# Patient Record
Sex: Male | Born: 1955 | Race: Black or African American | Hispanic: No | Marital: Single | Smoking: Current some day smoker
Health system: Southern US, Community
[De-identification: ages and names within clinical notes are randomized; demographics above are authoritative.]

## PROBLEM LIST (undated history)

## (undated) DIAGNOSIS — M1990 Unspecified osteoarthritis, unspecified site: Secondary | ICD-10-CM

## (undated) DIAGNOSIS — B1920 Unspecified viral hepatitis C without hepatic coma: Secondary | ICD-10-CM

## (undated) DIAGNOSIS — J45909 Unspecified asthma, uncomplicated: Secondary | ICD-10-CM

## (undated) DIAGNOSIS — I10 Essential (primary) hypertension: Secondary | ICD-10-CM

## (undated) DIAGNOSIS — M549 Dorsalgia, unspecified: Secondary | ICD-10-CM

## (undated) HISTORY — PX: HEMORRHOID SURGERY: SHX153

---

## 2014-08-07 ENCOUNTER — Emergency Department
Admission: EM | Admit: 2014-08-07 | Discharge: 2014-08-07 | Disposition: A | Discharge status: Home / Self Care | Payer: Self-pay | Attending: Emergency Medicine | Admitting: Emergency Medicine

## 2014-08-07 ENCOUNTER — Encounter: Payer: Self-pay | Admitting: Emergency Medicine

## 2014-08-07 DIAGNOSIS — Z720 Tobacco use: Secondary | ICD-10-CM | POA: Insufficient documentation

## 2014-08-07 DIAGNOSIS — B86 Scabies: Secondary | ICD-10-CM | POA: Insufficient documentation

## 2014-08-07 MED ORDER — DIPHENHYDRAMINE HCL 50 MG PO CAPS
50.0000 mg | ORAL_CAPSULE | Freq: Once | ORAL | Status: AC
  Administered 2014-08-07: 50 mg via ORAL
  Filled 2014-08-07: qty 1

## 2014-08-07 MED ORDER — PERMETHRIN 5 % EX CREA
TOPICAL_CREAM | Freq: Once | CUTANEOUS | Status: AC
  Administered 2014-08-07: 1 via TOPICAL
  Filled 2014-08-07: qty 60

## 2014-08-07 NOTE — ED Provider Notes (Signed)
[facility] Emergency Department Provider Note  ____________________________________________  Time seen: 5:45 AM  I have reviewed the triage vital signs and the nursing notes.   HISTORY  Chief Complaint Rash     HPI Connor Marquez is a 59 y.o. male presents with generalized pruritic rash 2 days on trunk and bilateral upper extremities. Patient denies any fever. Patient admits to having pets at home. Patient describes her axes "itching and burning all over"     Past medical history None  Past surgical history None No current outpatient prescriptions on file.  Allergies Review of patient's allergies indicates no known allergies.  No family history on file.  Social History History  Substance Use Topics  . Smoking status: Current Every Day Smoker -- 1.00 packs/day    Types: Cigarettes  . Smokeless tobacco: Not on file  . Alcohol Use: Yes    Review of Systems  Constitutional: Negative for fever. Eyes: Negative for visual changes. ENT: Negative for sore throat. Cardiovascular: Negative for chest pain. Respiratory: Negative for shortness of breath. Gastrointestinal: Negative for abdominal pain, vomiting and diarrhea. Genitourinary: Negative for dysuria. Musculoskeletal: Negative for back pain. Skin: Negative for rash. Neurological: Negative for headaches, focal weakness or numbness.   10-point ROS otherwise negative.  ____________________________________________   PHYSICAL EXAM:  VITAL SIGNS: ED Triage Vitals  Enc Vitals Group     BP 08/07/14 0531 133/83 mmHg     Pulse Rate 08/07/14 0531 60     Resp 08/07/14 0531 18     Temp 08/07/14 0531 97.8 F (36.6 C)     Temp Source 08/07/14 0531 Oral     SpO2 08/07/14 0531 98 %     Weight 08/07/14 0531 190 lb (86.183 kg)     Height 08/07/14 0531 6' (1.829 m)     Head Cir --      Peak Flow --      Pain Score 08/07/14 0532 10     Pain Loc --      Pain Edu? --      Excl. in GC? --       Constitutional: Alert and oriented. Well appearing and in no distress. Eyes: Conjunctivae are normal. PERRL. Normal extraocular movements. ENT   Head: Normocephalic and atraumatic.   Nose: No congestion/rhinnorhea.   Mouth/Throat: Mucous membranes are moist.   Neck: No stridor. Cardiovascular: Normal rate, regular rhythm. Normal and symmetric distal pulses are present in all extremities. No murmurs, rubs, or gallops. Respiratory: Normal respiratory effort without tachypnea nor retractions. Breath sounds are clear and equal bilaterally. No wheezes/rales/rhonchi. Gastrointestinal: Soft and nontender. No distention. There is no CVA tenderness. Genitourinary: deferred Musculoskeletal: Nontender with normal range of motion in all extremities. No joint effusions.  No lower extremity tenderness nor edema. Neurologic:  Normal speech and language. No gross focal neurologic deficits are appreciated. Speech is normal.  Skin:  Generalized rash consistent with scabies areas of creeping eruption. Psychiatric: Mood and affect are normal. Speech and behavior are normal. Patient exhibits appropriate insight and judgment.       INITIAL IMPRESSION / ASSESSMENT AND PLAN / ED COURSE  Pertinent labs & imaging results that were available during my care of the patient were reviewed by me and considered in my medical decision making (see chart for details).  Patient given permethrin and Benadryl  ____________________________________________   FINAL CLINICAL IMPRESSION(S) / ED DIAGNOSES  Final diagnoses:  Scabies      Darci Current, MD 08/07/14 [phone]

## 2014-08-07 NOTE — ED Notes (Signed)

## 2014-08-07 NOTE — ED Notes (Signed)
Patient present to ED with complaint of generalized body pain and rash on stomach and bilateral arms beginning 2 days ago. Patient believes rash is from "working on trees." Rash noted to abdomen, chest, and bilateral arms. Patient complaining of "itching and burning all over" and cramping in hands 15 min PTAPatient denies chest pain, shortness of breath, abdominal pain, urinary problems. Patient alert and oriented x 4, respirations even and unlabored.

## 2014-08-07 NOTE — Discharge Instructions (Signed)

## 2014-08-29 ENCOUNTER — Encounter: Payer: Self-pay | Admitting: Emergency Medicine

## 2014-08-29 ENCOUNTER — Emergency Department
Admission: EM | Admit: 2014-08-29 | Discharge: 2014-08-29 | Disposition: A | Discharge status: Home / Self Care | Payer: Self-pay | Attending: Emergency Medicine | Admitting: Emergency Medicine

## 2014-08-29 DIAGNOSIS — Z720 Tobacco use: Secondary | ICD-10-CM | POA: Insufficient documentation

## 2014-08-29 DIAGNOSIS — L255 Unspecified contact dermatitis due to plants, except food: Secondary | ICD-10-CM | POA: Insufficient documentation

## 2014-08-29 MED ORDER — HYDROXYZINE PAMOATE 25 MG PO CAPS: 25.0000 mg | ORAL_CAPSULE | Freq: Three times a day (TID) | ORAL | Status: DC | PRN

## 2014-08-29 MED ORDER — DEXAMETHASONE SODIUM PHOSPHATE 10 MG/ML IJ SOLN
10.0000 mg | Freq: Once | INTRAMUSCULAR | Status: AC
  Administered 2014-08-29: 10 mg via INTRAMUSCULAR
  Filled 2014-08-29: qty 1

## 2014-08-29 MED ORDER — PREDNISONE 10 MG PO TABS: 60.0000 mg | ORAL_TABLET | Freq: Every day | ORAL | Status: DC

## 2014-08-29 NOTE — Discharge Instructions (Signed)

## 2014-08-29 NOTE — ED Provider Notes (Signed)
[facility] Emergency Department Provider Note ____________________________________________  Time seen: Approximately 9:50 AM  I have reviewed the triage vital signs and the nursing notes.   HISTORY  Chief Complaint Rash   HPI Connor Marquez is a 59 y.o. male presents to the emergency department for evaluation of rash.He states he has been working outside clearing brush and has developed a rash to his hands and legs that is now spreading to his entire body including his face. He states that the rash is causing significant itching. He denies feeling shortness of breath or feeling that his throat is swollen inside.   History reviewed. No pertinent past medical history.  There are no active problems to display for this patient.   History reviewed. No pertinent past surgical history.  Current Outpatient Rx  Name  Route  Sig  Dispense  Refill  . hydrOXYzine (VISTARIL) 25 MG capsule   Oral   Take 1 capsule (25 mg total) by mouth 3 (three) times daily as needed.   30 capsule   0   . predniSONE (DELTASONE) 10 MG tablet   Oral   Take 6 tablets (60 mg total) by mouth daily. Take 6 tablets x 3 days Take 5 tablets x 3 days Take 4 tablets x 3 days Take 3 tablets x 3 days Take 2 tablets x 3 days Take 1 tablet x 3 days   63 tablet   0     Allergies Review of patient's allergies indicates no known allergies.  No family history on file.  Social History Social History  Substance Use Topics  . Smoking status: Current Every Day Smoker -- 1.00 packs/day    Types: Cigarettes  . Smokeless tobacco: Never Used  . Alcohol Use: Yes    Review of Systems   Constitutional: No fever/chills Eyes: No visual changes. ENT: No difficulty swallowing. No rash/blisters in ears, nose, or throat Cardiovascular: Denies chest pain. Respiratory: Denies shortness of breath. Gastrointestinal: No abdominal pain.  No nausea, no vomiting.  No diarrhea.  No  constipation. Genitourinary: Negative for dysuria. Musculoskeletal: Negative for back pain. Skin: Rash all over Neurological: Negative for headaches, focal weakness or numbness.  10-point ROS otherwise negative.  ____________________________________________   PHYSICAL EXAM:  VITAL SIGNS: ED Triage Vitals  Enc Vitals Group     BP 08/29/14 0918 127/77 mmHg     Pulse Rate 08/29/14 0918 80     Resp 08/29/14 0918 18     Temp 08/29/14 0918 97.8 F (36.6 C)     Temp Source 08/29/14 0918 Oral     SpO2 08/29/14 0918 97 %     Weight 08/29/14 0918 195 lb (88.451 kg)     Height 08/29/14 0918 6' (1.829 m)     Head Cir --      Peak Flow --      Pain Score 08/29/14 0920 8     Pain Loc --      Pain Edu? --      Excl. in GC? --     Constitutional: Alert and oriented. Well appearing and in no acute distress. Eyes: Conjunctivae are normal. PERRL. EOMI. Head: Atraumatic. Nose: No congestion/rhinnorhea. Mouth/Throat: Mucous membranes are moist.  Oropharynx non-erythematous. No oral lesions. Neck: No stridor. Cardiovascular: Normal rate, regular rhythm.  Good peripheral circulation. Respiratory: Normal respiratory effort.  No retractions. Lungs CTAB. Gastrointestinal: Soft and nontender. No distention. No abdominal bruits.  Musculoskeletal: No lower extremity tenderness nor edema.  No joint effusions. Neurologic:  Normal speech and language. No gross focal neurologic deficits are appreciated. Speech is normal. No gait instability. Skin: Vesicular rash covering forearms, legs, and abdomen. Scattered vesicles present on eyelids, which are mildly edematous. Psychiatric: Mood and affect are normal. Speech and behavior are normal.  ____________________________________________   LABS (all labs ordered are listed, but only abnormal results are displayed)  Labs Reviewed - No data to  display ____________________________________________  EKG   ____________________________________________  RADIOLOGY   ____________________________________________   PROCEDURES  Procedure(s) performed:  ____________________________________________   INITIAL IMPRESSION / ASSESSMENT AND PLAN / ED COURSE  Pertinent labs & imaging results that were available during my care of the patient were reviewed by me and considered in my medical decision making (see chart for details).  IM Decadron given. Rx for 14 day taper given as well. Patient instructed to return to the ER for symptoms that are not improving over the next 24-48 hours. ____________________________________________   FINAL CLINICAL IMPRESSION(S) / ED DIAGNOSES  Final diagnoses:  Contact dermatitis due to plant       Chinita Pester, FNP 08/29/14 1424  Jene Every, MD 08/29/14 [phone]

## 2014-08-29 NOTE — ED Notes (Signed)
Pt reports rash that started yesterday noted all over body. Pt also reports itching and blistering of rash. Unknown fever reports cold chills

## 2014-08-29 NOTE — ED Notes (Signed)
Pt has red rash noted on arms and face. Pt states that it is all over body.

## 2014-11-12 ENCOUNTER — Emergency Department
Admission: EM | Admit: 2014-11-12 | Discharge: 2014-11-12 | Disposition: A | Discharge status: Home / Self Care | Payer: Self-pay | Attending: Emergency Medicine | Admitting: Emergency Medicine

## 2014-11-12 DIAGNOSIS — J069 Acute upper respiratory infection, unspecified: Secondary | ICD-10-CM | POA: Insufficient documentation

## 2014-11-12 DIAGNOSIS — Z720 Tobacco use: Secondary | ICD-10-CM | POA: Insufficient documentation

## 2014-11-12 MED ORDER — IBUPROFEN 800 MG PO TABS
800.0000 mg | ORAL_TABLET | Freq: Once | ORAL | Status: AC
  Administered 2014-11-12: 800 mg via ORAL
  Filled 2014-11-12: qty 1

## 2014-11-12 MED ORDER — DEXAMETHASONE 6 MG PO TABS
6.0000 mg | ORAL_TABLET | Freq: Once | ORAL | Status: AC
  Administered 2014-11-12: 6 mg via ORAL
  Filled 2014-11-12: qty 1

## 2014-11-12 MED ORDER — IBUPROFEN 200 MG PO TABS: 600.0000 mg | ORAL_TABLET | Freq: Four times a day (QID) | ORAL | Status: DC | PRN

## 2014-11-12 MED ORDER — GUAIFENESIN 100 MG/5ML PO SOLN: 5.0000 mL | ORAL | Status: DC | PRN

## 2014-11-12 MED ORDER — ACETAMINOPHEN 500 MG PO TABS
1000.0000 mg | ORAL_TABLET | Freq: Once | ORAL | Status: AC
  Administered 2014-11-12: 1000 mg via ORAL
  Filled 2014-11-12: qty 2

## 2014-11-12 MED ORDER — GI COCKTAIL ~~LOC~~
30.0000 mL | ORAL | Status: AC
  Administered 2014-11-12: 30 mL via ORAL
  Filled 2014-11-12: qty 30

## 2014-11-12 NOTE — ED Notes (Signed)
Pt dc home ambulatory pain unchanged instructed on follow up plan and med use PT NAD AT DC 

## 2014-11-12 NOTE — ED Provider Notes (Signed)
[facility] Emergency Department Provider Note  ____________________________________________  Time seen: 6:10 AM  I have reviewed the triage vital signs and the nursing notes.   HISTORY  Chief Complaint Migraine and Recurrent Sinusitis    HPI Connor Marquez is a 59 y.o. male who complains of sinus pressure in the left side of the face for the past 2 days. He is also had a sore throat and nonproductive cough. No fevers or chills. He also has rhinorrhea. No body aches. No trauma. No chest pain shortness of breath dizziness or syncope.     History reviewed. No pertinent past medical history.   There are no active problems to display for this patient.    History reviewed. No pertinent past surgical history.   Current Outpatient Rx  Name  Route  Sig  Dispense  Refill  . guaiFENesin (ROBITUSSIN) 100 MG/5ML SOLN   Oral   Take 5 mLs (100 mg total) by mouth every 4 (four) hours as needed for cough or to loosen phlegm.   120 mL   0   . hydrOXYzine (VISTARIL) 25 MG capsule   Oral   Take 1 capsule (25 mg total) by mouth 3 (three) times daily as needed.   30 capsule   0   . ibuprofen (MOTRIN IB) 200 MG tablet   Oral   Take 3 tablets (600 mg total) by mouth every 6 (six) hours as needed.   60 tablet   0   . predniSONE (DELTASONE) 10 MG tablet   Oral   Take 6 tablets (60 mg total) by mouth daily. Take 6 tablets x 3 days Take 5 tablets x 3 days Take 4 tablets x 3 days Take 3 tablets x 3 days Take 2 tablets x 3 days Take 1 tablet x 3 days   63 tablet   0      Allergies Review of patient's allergies indicates no known allergies.   History reviewed. No pertinent family history.  Social History Social History  Substance Use Topics  . Smoking status: Current Every Day Smoker -- 1.00 packs/day    Types: Cigarettes  . Smokeless tobacco: Never Used  . Alcohol Use: Yes    Review of Systems  Constitutional:   No fever or chills. No  weight changes Eyes:   No blurry vision or double vision.  ENT:   Positive sore throat. Positive sinus pressure worse on the left Cardiovascular:   No chest pain. Respiratory:   No dyspnea or cough. Gastrointestinal:   Negative for abdominal pain, vomiting and diarrhea.  No BRBPR or melena. Genitourinary:   Negative for dysuria, urinary retention, bloody urine, or difficulty urinating. Musculoskeletal:   Negative for back pain. No joint swelling or pain. Skin:   Negative for rash. Neurological:   Negative for headaches, focal weakness or numbness. Psychiatric:  No anxiety or depression.   Endocrine:  No hot/cold intolerance, changes in energy, or sleep difficulty.  10-point ROS otherwise negative.  ____________________________________________   PHYSICAL EXAM:  VITAL SIGNS: ED Triage Vitals  Enc Vitals Group     BP 11/12/14 0543 136/82 mmHg     Pulse Rate 11/12/14 0543 84     Resp 11/12/14 0543 18     Temp 11/12/14 0543 99 F (37.2 C)     Temp Source 11/12/14 0543 Oral     SpO2 11/12/14 0543 97 %     Weight 11/12/14 0539 190 lb (86.183 kg)     Height 11/12/14 0539   (1.854 m)     Head Cir --      Peak Flow --      Pain Score 11/12/14 0540 9     Pain Loc --      Pain Edu? --      Excl. in GC? --      Constitutional:   Alert and oriented. Well appearing and in no distress. Eyes:   No scleral icterus. No conjunctival pallor. PERRL. EOMI ENT   Head:   Normocephalic and atraumatic. TMs normal except for mild injection of the left TM. External canals normal. No pain on percussion of sinuses   Nose:   No congestion/rhinnorhea. No septal hematoma. Slightly boggy turbinates with some increased secretions   Mouth/Throat:   MMM, significant pharyngeal erythema. No peritonsillar mass. No uvula shift.   Neck:   No stridor. No SubQ emphysema. No meningismus. Full range of motion Hematological/Lymphatic/Immunilogical:   No cervical lymphadenopathy. Cardiovascular:    RRR. Normal and symmetric distal pulses are present in all extremities. No murmurs, rubs, or gallops. Respiratory:   Normal respiratory effort without tachypnea nor retractions. Breath sounds are clear and equal bilaterally. No wheezes/rales/rhonchi. Gastrointestinal:   Soft and nontender. No distention. There is no CVA tenderness.  No rebound, rigidity, or guarding. Genitourinary:   deferred Musculoskeletal:   Nontender with normal range of motion in all extremities. No joint effusions.  No lower extremity tenderness.  No edema. Neurologic:   Normal speech and language.  CN 2-10 normal. Motor grossly intact. No pronator drift.  Normal gait. No gross focal neurologic deficits are appreciated.  Skin:    Skin is warm, dry and intact. No rash noted.  No petechiae, purpura, or bullae. Psychiatric:   Mood and affect are normal. Speech and behavior are normal. Patient exhibits appropriate insight and judgment.  ____________________________________________    LABS (pertinent positives/negatives) (all labs ordered are listed, but only abnormal results are displayed) Labs Reviewed - No data to display ____________________________________________   EKG    ____________________________________________    RADIOLOGY    ____________________________________________   PROCEDURES   ____________________________________________   INITIAL IMPRESSION / ASSESSMENT AND PLAN / ED COURSE  Pertinent labs & imaging results that were available during my care of the patient were reviewed by me and considered in my medical decision making (see chart for details).  Patient presents with sore throat and nonproductive cough and sinus pressure. This is 2 days worth and he has unremarkable vital signs. Low suspicion for a malignant infection or bacterial sinusitis. At present it appears he has an upper respiratory infection. We'll give him some medications for symptom relief have him follow-up with  primary care in about a week. Added willfully requests pain medication for his sinus pain and so I did counsel him that this is not something that warrants opioid pain medications and anti-inflammatory pain medicine should be sufficient and appropriate for this indication. Low suspicion for intracranial hemorrhage or stroke. No evidence of glaucoma and temporal arteritis pseudotumor or trauma.     ____________________________________________   FINAL CLINICAL IMPRESSION(S) / ED DIAGNOSES  Final diagnoses:  Upper respiratory infection      Sharman Cheek, MD 11/12/14 [phone]

## 2014-11-12 NOTE — Discharge Instructions (Signed)
Upper Respiratory Infection, Adult Most upper respiratory infections (URIs) are a viral infection of the air passages leading to the lungs. A URI affects the nose, throat, and upper air passages. The most common type of URI is nasopharyngitis and is typically referred to as "the common cold." URIs run their course and usually go away on their own. Most of the time, a URI does not require medical attention, but sometimes a bacterial infection in the upper airways can follow a viral infection. This is called a secondary infection. Sinus and middle ear infections are common types of secondary upper respiratory infections. Bacterial pneumonia can also complicate a URI. A URI can worsen asthma and chronic obstructive pulmonary disease (COPD). Sometimes, these complications can require emergency medical care and may be life threatening.  CAUSES Almost all URIs are caused by viruses. A virus is a type of germ and can spread from one person to another.  RISKS FACTORS You may be at risk for a URI if:   You smoke.   You have chronic heart or lung disease.  You have a weakened defense (immune) system.   You are very young or very old.   You have nasal allergies or asthma.  You work in crowded or poorly ventilated areas.  You work in health care facilities or schools. SIGNS AND SYMPTOMS  Symptoms typically develop 2-3 days after you come in contact with a cold virus. Most viral URIs last 7-10 days. However, viral URIs from the influenza virus (flu virus) can last 14-18 days and are typically more severe. Symptoms may include:   Runny or stuffy (congested) nose.   Sneezing.   Cough.   Sore throat.   Headache.   Fatigue.   Fever.   Loss of appetite.   Pain in your forehead, behind your eyes, and over your cheekbones (sinus pain).  Muscle aches.  DIAGNOSIS  Your health care provider may diagnose a URI by:  Physical exam.  Tests to check that your symptoms are not due to  another condition such as:  Strep throat.  Sinusitis.  Pneumonia.  Asthma. TREATMENT  A URI goes away on its own with time. It cannot be cured with medicines, but medicines may be prescribed or recommended to relieve symptoms. Medicines may help:  Reduce your fever.  Reduce your cough.  Relieve nasal congestion. HOME CARE INSTRUCTIONS   Take medicines only as directed by your health care provider.   Gargle warm saltwater or take cough drops to comfort your throat as directed by your health care provider.  Use a warm mist humidifier or inhale steam from a shower to increase air moisture. This may make it easier to breathe.  Drink enough fluid to keep your urine clear or pale yellow.   Eat soups and other clear broths and maintain good nutrition.   Rest as needed.   Return to work when your temperature has returned to normal or as your health care provider advises. You may need to stay home longer to avoid infecting others. You can also use a face mask and careful hand washing to prevent spread of the virus.  Increase the usage of your inhaler if you have asthma.   Do not use any tobacco products, including cigarettes, chewing tobacco, or electronic cigarettes. If you need help quitting, ask your health care provider. PREVENTION  The best way to protect yourself from getting a cold is to practice good hygiene.   Avoid oral or hand contact with people with cold   symptoms.   Wash your hands often if contact occurs.  There is no clear evidence that vitamin C, vitamin E, echinacea, or exercise reduces the chance of developing a cold. However, it is always recommended to get plenty of rest, exercise, and practice good nutrition.  SEEK MEDICAL CARE IF:   You are getting worse rather than better.   Your symptoms are not controlled by medicine.   You have chills.  You have worsening shortness of breath.  You have brown or red mucus.  You have yellow or brown nasal  discharge.  You have pain in your face, especially when you bend forward.  You have a fever.  You have swollen neck glands.  You have pain while swallowing.  You have white areas in the back of your throat. SEEK IMMEDIATE MEDICAL CARE IF:   You have severe or persistent:  Headache.  Ear pain.  Sinus pain.  Chest pain.  You have chronic lung disease and any of the following:  Wheezing.  Prolonged cough.  Coughing up blood.  A change in your usual mucus.  You have a stiff neck.  You have changes in your:  Vision.  Hearing.  Thinking.  Mood. MAKE SURE YOU:   Understand these instructions.  Will watch your condition.  Will get help right away if you are not doing well or get worse.   This information is not intended to replace advice given to you by your health care provider. Make sure you discuss any questions you have with your health care provider.   Document Released: 06/28/2000 Document Revised: 05/19/2014 Document Reviewed: 04/09/2013 Elsevier Interactive Patient Education 2016 [employer]  

## 2014-11-12 NOTE — ED Notes (Signed)
Patient to ED with complaints of a migraine that he states he feels like is coming from a sinus infection "because I've got all this sinus congestion." Symptoms started two days ago.

## 2014-12-26 ENCOUNTER — Emergency Department
Admission: EM | Admit: 2014-12-26 | Discharge: 2014-12-26 | Disposition: A | Discharge status: Home / Self Care | Payer: Self-pay | Attending: Student | Admitting: Student

## 2014-12-26 ENCOUNTER — Encounter: Payer: Self-pay | Admitting: *Deleted

## 2014-12-26 ENCOUNTER — Emergency Department: Payer: Self-pay

## 2014-12-26 DIAGNOSIS — Y998 Other external cause status: Secondary | ICD-10-CM | POA: Insufficient documentation

## 2014-12-26 DIAGNOSIS — W1839XA Other fall on same level, initial encounter: Secondary | ICD-10-CM | POA: Insufficient documentation

## 2014-12-26 DIAGNOSIS — I10 Essential (primary) hypertension: Secondary | ICD-10-CM | POA: Insufficient documentation

## 2014-12-26 DIAGNOSIS — Z7952 Long term (current) use of systemic steroids: Secondary | ICD-10-CM | POA: Insufficient documentation

## 2014-12-26 DIAGNOSIS — R55 Syncope and collapse: Secondary | ICD-10-CM | POA: Insufficient documentation

## 2014-12-26 DIAGNOSIS — S52021A Displaced fracture of olecranon process without intraarticular extension of right ulna, initial encounter for closed fracture: Secondary | ICD-10-CM | POA: Insufficient documentation

## 2014-12-26 DIAGNOSIS — Y9301 Activity, walking, marching and hiking: Secondary | ICD-10-CM | POA: Insufficient documentation

## 2014-12-26 DIAGNOSIS — F17210 Nicotine dependence, cigarettes, uncomplicated: Secondary | ICD-10-CM | POA: Insufficient documentation

## 2014-12-26 DIAGNOSIS — Y9289 Other specified places as the place of occurrence of the external cause: Secondary | ICD-10-CM | POA: Insufficient documentation

## 2014-12-26 HISTORY — DX: Unspecified viral hepatitis C without hepatic coma: B19.20

## 2014-12-26 HISTORY — DX: Essential (primary) hypertension: I10

## 2014-12-26 HISTORY — DX: Unspecified osteoarthritis, unspecified site: M19.90

## 2014-12-26 LAB — BASIC METABOLIC PANEL
Anion gap: 6 (ref 5–15)
BUN: 16 mg/dL (ref 6–20)
CO2: 29 mmol/L (ref 22–32)
CREATININE: 1.2 mg/dL (ref 0.61–1.24)
Calcium: 9.2 mg/dL (ref 8.9–10.3)
Chloride: 104 mmol/L (ref 101–111)
GFR calc Af Amer: >60 mL/min (ref >60)
GFR calc non Af Amer: >60 mL/min (ref >60)
Glucose, Bld: 103 mg/dL — ABNORMAL HIGH (ref 65–99)
POTASSIUM: 4.2 mmol/L (ref 3.5–5.1)
SODIUM: 139 mmol/L (ref 135–145)

## 2014-12-26 LAB — CBC
HCT: 44.7 % (ref 40.0–52.0)
Hemoglobin: 15.3 g/dL (ref 13.0–18.0)
MCH: 32.3 pg (ref 26.0–34.0)
MCHC: 34.2 g/dL (ref 32.0–36.0)
MCV: 94.4 fL (ref 80.0–100.0)
Platelets: 156 K/uL (ref 150–440)
RBC: 4.74 MIL/uL (ref 4.40–5.90)
RDW: 13.5 % (ref 11.5–14.5)
WBC: 6.5 K/uL (ref 3.8–10.6)

## 2014-12-26 LAB — TROPONIN I: Troponin I: <0.03 ng/mL

## 2014-12-26 MED ORDER — MORPHINE SULFATE (PF) 4 MG/ML IV SOLN
4.0000 mg | Freq: Once | INTRAVENOUS | Status: AC
  Administered 2014-12-26: 4 mg via INTRAVENOUS
  Filled 2014-12-26: qty 1

## 2014-12-26 MED ORDER — OXYCODONE HCL 5 MG PO TABS: 5.0000 mg | ORAL_TABLET | Freq: Four times a day (QID) | ORAL | Status: DC | PRN

## 2014-12-26 MED ORDER — ONDANSETRON HCL 4 MG/2ML IJ SOLN
4.0000 mg | Freq: Once | INTRAMUSCULAR | Status: AC
  Administered 2014-12-26: 4 mg via INTRAVENOUS
  Filled 2014-12-26: qty 2

## 2014-12-26 NOTE — ED Notes (Signed)
Pt. returned from XR. 

## 2014-12-26 NOTE — ED Notes (Signed)
Pt splinted with 5" cotton material, 5" splinting material, and Large splint to right arm.  OK'd by Dr. Inocencio HomesGayle

## 2014-12-26 NOTE — ED Notes (Signed)
Pt reports syncopal episode when walking to the bathroom. When he woke, he was laying on the floor. Pt states he remained light-headed after regaining consciousness and sustained injury to R elbow at this time and presents to the ED w/ swelling and pain to  R elbow. Pt states he continues to be slightly dizzy at this time. Grips and push pulls on legs and arms equal and strong. Pt has no facial droop or tongue deviation. Pt's primary complaint is R elbow pain.

## 2014-12-26 NOTE — ED Provider Notes (Addendum)
[facility] Emergency Department Provider Note  ____________________________________________  Time seen: Approximately 3:44 AM  I have reviewed the triage vital signs and the nursing notes.   HISTORY  Chief Complaint Loss of Consciousness and Elbow Injury    HPI Connor Marquez is a 59 y.o. male with history of hypertension, arthritis, hepatitis C who presents for evaluation of syncopal episode with fall and right elbow pain which began suddenly just PTA,  Pain has been constant since onset, is worse with movement, currently severe. Patient reports that he stood up to go to the bathroom. He began walking to the bathroom and started feeling lightheaded and fainted, falling onto the right elbow. He denies hitting his head. He reports that when he woke up he still continuing to feel lightheaded. No chest pain or difficulty breathing, no vomiting, diarrhea, fevers or chills. No palpitations. He denies any headache or neck pain. He has had syncopal episodes similar to this in the past.   Past Medical History  Diagnosis Date  . Hypertension   . Arthritis   . Hepatitis C     There are no active problems to display for this patient.   Past Surgical History  Procedure Laterality Date  . Hemorrhoid surgery      Current Outpatient Rx  Name  Route  Sig  Dispense  Refill  . guaiFENesin (ROBITUSSIN) 100 MG/5ML SOLN   Oral   Take 5 mLs (100 mg total) by mouth every 4 (four) hours as needed for cough or to loosen phlegm.   120 mL   0   . hydrOXYzine (VISTARIL) 25 MG capsule   Oral   Take 1 capsule (25 mg total) by mouth 3 (three) times daily as needed.   30 capsule   0   . ibuprofen (MOTRIN IB) 200 MG tablet   Oral   Take 3 tablets (600 mg total) by mouth every 6 (six) hours as needed.   60 tablet   0   . oxyCODONE (ROXICODONE) 5 MG immediate release tablet   Oral   Take 1 tablet (5 mg total) by mouth every 6 (six) hours as needed for moderate pain. Do  not drive while taking this medication.   15 tablet   0   . predniSONE (DELTASONE) 10 MG tablet   Oral   Take 6 tablets (60 mg total) by mouth daily. Take 6 tablets x 3 days Take 5 tablets x 3 days Take 4 tablets x 3 days Take 3 tablets x 3 days Take 2 tablets x 3 days Take 1 tablet x 3 days   63 tablet   0     Allergies Review of patient's allergies indicates no known allergies.  History reviewed. No pertinent family history.  Social History Social History  Substance Use Topics  . Smoking status: Current Every Day Smoker -- 1.00 packs/day    Types: Cigarettes  . Smokeless tobacco: Never Used  . Alcohol Use: Yes     Comment: occasionally    Review of Systems Constitutional: No fever/chills Eyes: No visual changes. ENT: No sore throat. Cardiovascular: Denies chest pain. Respiratory: Denies shortness of breath. Gastrointestinal: No abdominal pain.  No nausea, no vomiting.  No diarrhea.  No constipation. Genitourinary: Negative for dysuria. Musculoskeletal: Negative for back pain. Skin: Negative for rash. Neurological: Negative for headaches, focal weakness or numbness.  10-point ROS otherwise negative.  ____________________________________________   PHYSICAL EXAM:  VITAL SIGNS: ED Triage Vitals  Enc Vitals Group  BP 12/26/14 0233 140/77 mmHg     Pulse Rate 12/26/14 0233 73     Resp 12/26/14 0233 18     Temp 12/26/14 0233 98.2 F (36.8 C)     Temp Source 12/26/14 0233 Oral     SpO2 12/26/14 0233 98 %     Weight 12/26/14 0233 190 lb (86.183 kg)     Height 12/26/14 0233 6' (1.829 m)     Head Cir --      Peak Flow --      Pain Score 12/26/14 0243 10     Pain Loc --      Pain Edu? --      Excl. in GC? --     Constitutional: Alert and oriented. Well appearing and generally in no acute distress though He grimaces in pain with movement of the right elbow. Eyes: Conjunctivae are normal. PERRL. EOMI. Head: Atraumatic. Nose: No  congestion/rhinnorhea. Mouth/Throat: Mucous membranes are moist.  Oropharynx non-erythematous. Neck: No stridor.  No midline C-spine tenderness to palpation. Cardiovascular: Normal rate, regular rhythm. Grossly normal heart sounds.  Good peripheral circulation. Respiratory: Normal respiratory effort.  No retractions. Lungs CTAB. Gastrointestinal: Soft and nontender. No distention.  No CVA tenderness. Genitourinary: deferred Musculoskeletal: Tenderness throughout the right elbow however the patient has full though painful flexion and extension at the elbow. The right radial, median and ulnar nerve are intact. 2+ right radial pulse. Neurologic:  Normal speech and language. No gross focal neurologic deficits are appreciated. No gait instability. 5 out of 5 strength in bilateral upper and lower extremity, sensation intact to light touch throughout. Skin:  Skin is warm, dry and intact. No rash noted. Psychiatric: Mood and affect are normal. Speech and behavior are normal.  ____________________________________________   LABS (all labs ordered are listed, but only abnormal results are displayed)  Labs Reviewed  BASIC METABOLIC PANEL - Abnormal; Notable for the following:    Glucose, Bld 103 (*)    All other components within normal limits  CBC  TROPONIN I  URINALYSIS COMPLETEWITH MICROSCOPIC ([facility] ONLY)  CBG MONITORING, ED   ____________________________________________  EKG  ED ECG REPORT I, Gayla DossGayle, Burnett Lieber A, the attending physician, personally viewed and interpreted this ECG.   Date: 12/26/2014  EKG Time: 02:23  Rate: 73  Rhythm: normal EKG, normal sinus rhythm  Axis: normal  Intervals:none  ST&T Change: No acute ST elevation. No STEMI, no Brugada, normal QTC.  ____________________________________________  RADIOLOGY  CXR IMPRESSION: No acute pulmonary process.  Right elbow xray FINDINGS: There is a prominent olecranon spur, linear lucency through the spur may reflect  fragmentation versus spur fracture. There is associated soft tissue prominence in the region of the spur. Otherwise no acute fracture. Alignment is maintained. Mild degenerative change at the proximal radial ulnar joint. No joint effusion.  IMPRESSION: Olecranon spur with adjacent soft tissue prominence. Linear lucency through the spur may reflect fragmentation versus a spur fracture.  There is otherwise no acute fracture of the elbow. ____________________________________________   PROCEDURES  Procedure(s) performed:   SPLINT APPLICATION Date/Time: 6:02 AM Authorized by: Toney RakesGayle, Ole Lafon A Consent: Verbal consent obtained. Consent given by: patient Splint applied by: nurse Location details: right arm Splint type: long arm splint with posterior slab Supplies used: orthoglas Post-procedure: The splinted body part was neurovascularly unchanged following the procedure. Patient tolerance: Patient tolerated the procedure well with no immediate complications.    Critical Care performed: No  ____________________________________________   INITIAL IMPRESSION / ASSESSMENT AND PLAN / ED  COURSE  Pertinent labs & imaging results that were available during my care of the patient were reviewed by me and considered in my medical decision making (see chart for details).   Connor Marquez is a 59 y.o. male with history of hypertension, arthritis, hepatitis C who presents for evaluation of syncopal episode with fall and right elbow pain which began suddenly just PTA,  Pain has been constant since onset, is worse with movement, currently severe. On exam, he is generally well-appearing and in no acute distress he does have pain and tenderness at the right elbow. He is neurovascularly intact in the right arm. His exam is otherwise atraumatic and has no other pain complaints. Suspect vasovagal syncope with possible component of orthostasis given described prodrome, sudden onset after standing. No  chest pain or difficulty breathing, EKG normal sinus rhythm, doubt Cardiogenic syncope. Troponin negative. Intact neurological examination and I doubt purely neurogenic cause of syncope. Head is atraumatic. No history of any CNS disorder and I do not think he requires CT head at this time. CBC and BMP are unremarkable. Chest x-ray clear. Plain films of the right elbow show a linear lucency through a prominent olecranon spur which could represent fragmentation versus a spur fracture. I discussed this with Dr. Hyacinth Meeker, on-call for orthopedic surgery, he has reviewed films, reports equivocal fracture but as the patient is neurovascular intact with intact tricep function, he recommends splinting, pain control and discharge. I discussed this with the patient as well as return precautions and he is comfortable with the discharge plan. DC home. ____________________________________________   FINAL CLINICAL IMPRESSION(S) / ED DIAGNOSES  Final diagnoses:  Vasovagal syncope  Olecranon fracture, right, closed, initial encounter      Gayla Doss, MD 12/26/14 1610  Gayla Doss, MD 12/26/14 9604  Gayla Doss, MD 12/26/14 [phone]

## 2014-12-26 NOTE — ED Notes (Signed)
Pt to XR

## 2015-07-07 ENCOUNTER — Emergency Department: Payer: Self-pay

## 2015-07-07 ENCOUNTER — Emergency Department
Admission: EM | Admit: 2015-07-07 | Discharge: 2015-07-07 | Disposition: A | Discharge status: Home / Self Care | Payer: Self-pay | Attending: Emergency Medicine | Admitting: Emergency Medicine

## 2015-07-07 DIAGNOSIS — I10 Essential (primary) hypertension: Secondary | ICD-10-CM | POA: Insufficient documentation

## 2015-07-07 DIAGNOSIS — M1990 Unspecified osteoarthritis, unspecified site: Secondary | ICD-10-CM | POA: Insufficient documentation

## 2015-07-07 DIAGNOSIS — M7022 Olecranon bursitis, left elbow: Secondary | ICD-10-CM | POA: Insufficient documentation

## 2015-07-07 DIAGNOSIS — F17210 Nicotine dependence, cigarettes, uncomplicated: Secondary | ICD-10-CM | POA: Insufficient documentation

## 2015-07-07 DIAGNOSIS — Y939 Activity, unspecified: Secondary | ICD-10-CM | POA: Insufficient documentation

## 2015-07-07 DIAGNOSIS — M779 Enthesopathy, unspecified: Secondary | ICD-10-CM

## 2015-07-07 DIAGNOSIS — F1290 Cannabis use, unspecified, uncomplicated: Secondary | ICD-10-CM | POA: Insufficient documentation

## 2015-07-07 DIAGNOSIS — M25722 Osteophyte, left elbow: Secondary | ICD-10-CM | POA: Insufficient documentation

## 2015-07-07 DIAGNOSIS — Z7952 Long term (current) use of systemic steroids: Secondary | ICD-10-CM | POA: Insufficient documentation

## 2015-07-07 MED ORDER — MELOXICAM 15 MG PO TABS: 15.0000 mg | ORAL_TABLET | Freq: Every day | ORAL | Status: DC

## 2015-07-07 MED ORDER — TRAMADOL HCL 50 MG PO TABS: 50.0000 mg | ORAL_TABLET | Freq: Four times a day (QID) | ORAL | Status: DC | PRN

## 2015-07-07 MED ORDER — TRAMADOL HCL 50 MG PO TABS
50.0000 mg | ORAL_TABLET | Freq: Once | ORAL | Status: AC
  Administered 2015-07-07: 50 mg via ORAL
  Filled 2015-07-07: qty 1

## 2015-07-07 MED ORDER — SULFAMETHOXAZOLE-TRIMETHOPRIM 800-160 MG PO TABS: 1.0000 | ORAL_TABLET | Freq: Two times a day (BID) | ORAL | Status: DC

## 2015-07-07 NOTE — ED Notes (Signed)
Pt to ED via POV c/o left elbow pain. Per pt elbow pain is sharp and came on all of sudden a couple of days ago and has gotten progressively worse. Pt denies injury. Strong radial pulse, cap refill < 3 sec; pt alert and oriented in no acute distress at this time.

## 2015-07-07 NOTE — Discharge Instructions (Signed)
Elbow Bursitis  Elbow bursitis is inflammation of the fluid-filled sac (bursa) between the tip of your elbow bone (olecranon) and your skin. Elbow bursitis may also be called olecranon bursitis.  Normally, the olecranon bursa has only a small amount of fluid in it to cushion and protect your elbow bone. Elbow bursitis causes fluid to build up inside the bursa. Over time, this swelling and inflammation can cause pain when you bend or lean on your elbow.   CAUSES  Elbow bursitis may be caused by:    Elbow injury (acute trauma).   Leaning on hard surfaces for long periods of time.   Infection from an injury that breaks the skin near your elbow.   A bone growth (spur) that forms at the tip of your elbow.   A medical condition that causes inflammation in your body, such as gout or rheumatoid arthritis.   The cause may also be unknown.   SIGNS AND SYMPTOMS   The first sign of elbow bursitis is usually swelling over the tip of your elbow. This can grow to be the size of a golf ball. This may start suddenly or develop gradually. You may also have:   Pain when bending or leaning on your elbow.   Restricted movement of your elbow.   If your bursitis is caused by an infection, symptoms may also include:   Redness, warmth, and tenderness of the elbow.   Drainage of pus from the swollen area over your elbow, if the skin breaks open.  DIAGNOSIS   Your health care provider may be able to diagnose elbow bursitis based on your signs and symptoms, especially if you have recently been injured. Your health care provider will also do a physical exam. This may include:   X-rays to look for a bone spur or a bone fracture.   Draining fluid from the bursa to test it for infection.   Blood tests to rule out gout or rheumatoid arthritis.  TREATMENT   Treatment for elbow bursitis depends on the cause. Treatment may include:   Medicines. These may include:    Over-the-counter medicines to relieve pain and inflammation.     Antibiotic medicines to fight infection.    Injections of anti-inflammatory medicines (steroids).   Wrapping your elbow with a bandage.   Draining fluid from the bursa.   Wearing elbow pads.   If your bursitis does not get better with treatment, surgery may be needed to remove the bursa.   HOME CARE INSTRUCTIONS    Take medicines only as directed by your health care provider.   If you were prescribed an antibiotic medicine, finish all of it even if you start to feel better.   If your bursitis is caused by an injury, rest your elbow and wear your bandage as directed by your health care provider. You may alsoapply ice to the injured area as directed by your health care provider:   Put ice in a plastic bag.   Place a towel between your skin and the bag.   Leave the ice on for 20 minutes, 2-3 times per day.   Avoid any activities that cause elbow pain.   Use elbow pads or elbow wraps to cushion your elbow.  SEEK MEDICAL CARE IF:   You have a fever.    Your symptoms do not get better with treatment.   Your pain or swelling gets worse.   Your elbow pain or swelling goes away and then returns.   You have   drainage of pus from the swollen area over your elbow.     This information is not intended to replace advice given to you by your health care provider. Make sure you discuss any questions you have with your health care provider.     Document Released: 02/01/2006 Document Revised: 01/23/2014 Document Reviewed: 09/10/2013  Elsevier Interactive Patient Education 2016 [employer]

## 2015-07-07 NOTE — ED Provider Notes (Signed)
[facility] Emergency Department Provider Note  ____________________________________________  Time seen: Approximately 1:57 PM  I have reviewed the triage vital signs and the nursing notes.   HISTORY  Chief Complaint Elbow Pain   HPI Connor Marquez is a 60 y.o. male who arrives to the emergency department with complaint of left elbow pain since Saturday. Pain has been worsening over the last several days, is described as a constant throbbing, irritating pain and rated at 9/10. States "It feels like there is something in it or something trying to come out of it, like the bone." He is unable to recall any trauma or insect bite to the elbow. He has not attempted any pharmacologic or non-pharmacologic management and states that he has found no alleviating factors. Pain is exacerbated by movement and applied pressure. Denies recent repetitive motion, fever, chills, headache, nausea and vomiting.   Past Medical History  Diagnosis Date  . Hypertension   . Arthritis   . Hepatitis C     There are no active problems to display for this patient.   Past Surgical History  Procedure Laterality Date  . Hemorrhoid surgery      Current Outpatient Rx  Name  Route  Sig  Dispense  Refill  . guaiFENesin (ROBITUSSIN) 100 MG/5ML SOLN   Oral   Take 5 mLs (100 mg total) by mouth every 4 (four) hours as needed for cough or to loosen phlegm.   120 mL   0   . meloxicam (MOBIC) 15 MG tablet   Oral   Take 1 tablet (15 mg total) by mouth daily.   30 tablet   0   . predniSONE (DELTASONE) 10 MG tablet   Oral   Take 6 tablets (60 mg total) by mouth daily. Take 6 tablets x 3 days Take 5 tablets x 3 days Take 4 tablets x 3 days Take 3 tablets x 3 days Take 2 tablets x 3 days Take 1 tablet x 3 days   63 tablet   0   . sulfamethoxazole-trimethoprim (BACTRIM DS,SEPTRA DS) 800-160 MG tablet   Oral   Take 1 tablet by mouth 2 (two) times daily.   20 tablet   0   .  traMADol (ULTRAM) 50 MG tablet   Oral   Take 1 tablet (50 mg total) by mouth every 6 (six) hours as needed.   12 tablet   0     Allergies Review of patient's allergies indicates no known allergies.  History reviewed. No pertinent family history.  Social History Social History  Substance Use Topics  . Smoking status: Current Every Day Smoker -- 1.00 packs/day    Types: Cigarettes  . Smokeless tobacco: Never Used  . Alcohol Use: Yes     Comment: occasionally    Review of Systems  Constitutional: Negative for fever/chills Respiratory: Negative for shortness of breath. Musculoskeletal: Positive for pain. Skin: Positive for lesion. Neurological: Negative for headaches, focal weakness or numbness. ____________________________________________   PHYSICAL EXAM:  VITAL SIGNS: ED Triage Vitals  Enc Vitals Group     BP 07/07/15 1334 145/93 mmHg     Pulse Rate 07/07/15 1334 74     Resp 07/07/15 1334 18     Temp 07/07/15 1334 98 F (36.7 C)     Temp Source 07/07/15 1334 Oral     SpO2 07/07/15 1334 98 %     Weight 07/07/15 1334 180 lb (81.647 kg)     Height 07/07/15 1334 6' (1.829  m)     Head Cir --      Peak Flow --      Pain Score 07/07/15 1335 9     Pain Loc --      Pain Edu? --      Excl. in GC? --      Constitutional: Alert and oriented. Well appearing and in no acute distress. Eyes: Conjunctivae are normal. EOMI. Nose: No congestion/rhinnorhea. Mouth/Throat: Mucous membranes are moist.   Neck: No stridor. Cardiovascular: Good peripheral circulation. Respiratory: Normal respiratory effort.  No retractions. Musculoskeletal: FROM throughout. Equal strength bilateral upper extremities, specifically triceps with 5+ strength bilaterally. Neurologic:  Normal speech and language. No gross focal neurologic deficits are appreciated. Skin:  2cm fluctuant area over the olecranon on the ulnar side that is draining clear/purulent substance. Mild surrounding erythema without  induration. FROM of left shoulder, wrist, hand, and fingers  ____________________________________________   LABS (all labs ordered are listed, but only abnormal results are displayed)  Labs Reviewed - No data to display ____________________________________________  EKG   ____________________________________________  RADIOLOGY  1. Soft tissue swelling overlying the olecranon, possible olecranon bursitis. 2. Fragmented spur of the olecranon - the fragmentation of this per could be acute given the appearance. Triceps injury not excluded, correlate with triceps strength. ____________________________________________   PROCEDURES  Procedure(s) performed: None ____________________________________________   INITIAL IMPRESSION / ASSESSMENT AND PLAN / ED COURSE  Pertinent labs & imaging results that were available during my care of the patient were reviewed by me and considered in my medical decision making (see chart for details).  He will be advised to take Bactrim as prescribed and until finished. He is to follow up with orthopedics and is to call today to schedule an appointment.    He was also advised to return to the emergency department for symptoms that change or worsen if unable to schedule an appointment.  ____________________________________________   FINAL CLINICAL IMPRESSION(S) / ED DIAGNOSES  Final diagnoses:  Olecranon bursitis of left elbow  Bone spur of other site    Discharge Medication List as of 07/07/2015  2:47 PM    START taking these medications   Details  meloxicam (MOBIC) 15 MG tablet Take 1 tablet (15 mg total) by mouth daily., Starting 07/07/2015, Until Discontinued, Print    sulfamethoxazole-trimethoprim (BACTRIM DS,SEPTRA DS) 800-160 MG tablet Take 1 tablet by mouth 2 (two) times daily., Starting 07/07/2015, Until Discontinued, Print    traMADol (ULTRAM) 50 MG tablet Take 1 tablet (50 mg total) by mouth every 6 (six) hours as needed., Starting  07/07/2015, Until Discontinued, Print        Chinita PesterCari B Bohdan Macho, FNP 07/07/15 1757  Minna AntisKevin Paduchowski, MD 07/08/15 2007

## 2016-03-11 ENCOUNTER — Emergency Department: Payer: Self-pay

## 2016-03-11 ENCOUNTER — Encounter: Payer: Self-pay | Admitting: Emergency Medicine

## 2016-03-11 ENCOUNTER — Emergency Department
Admission: EM | Admit: 2016-03-11 | Discharge: 2016-03-11 | Disposition: A | Discharge status: Home / Self Care | Payer: Self-pay | Attending: Emergency Medicine | Admitting: Emergency Medicine

## 2016-03-11 DIAGNOSIS — J45909 Unspecified asthma, uncomplicated: Secondary | ICD-10-CM | POA: Insufficient documentation

## 2016-03-11 DIAGNOSIS — I10 Essential (primary) hypertension: Secondary | ICD-10-CM | POA: Insufficient documentation

## 2016-03-11 DIAGNOSIS — F17210 Nicotine dependence, cigarettes, uncomplicated: Secondary | ICD-10-CM | POA: Insufficient documentation

## 2016-03-11 DIAGNOSIS — Z79899 Other long term (current) drug therapy: Secondary | ICD-10-CM | POA: Insufficient documentation

## 2016-03-11 DIAGNOSIS — R55 Syncope and collapse: Secondary | ICD-10-CM | POA: Insufficient documentation

## 2016-03-11 HISTORY — DX: Dorsalgia, unspecified: M54.9

## 2016-03-11 HISTORY — DX: Unspecified asthma, uncomplicated: J45.909

## 2016-03-11 LAB — LIPASE, BLOOD: Lipase: 28 U/L (ref 11–51)

## 2016-03-11 LAB — COMPREHENSIVE METABOLIC PANEL
ALBUMIN: 3.6 g/dL (ref 3.5–5.0)
ALT: 63 U/L (ref 17–63)
AST: 73 U/L — ABNORMAL HIGH (ref 15–41)
Alkaline Phosphatase: 98 U/L (ref 38–126)
Anion gap: 4 — ABNORMAL LOW (ref 5–15)
BUN: 12 mg/dL (ref 6–20)
CHLORIDE: 107 mmol/L (ref 101–111)
CO2: 28 mmol/L (ref 22–32)
CREATININE: 0.96 mg/dL (ref 0.61–1.24)
Calcium: 8.6 mg/dL — ABNORMAL LOW (ref 8.9–10.3)
GFR calc Af Amer: >60 mL/min (ref >60)
GLUCOSE: 108 mg/dL — AB (ref 65–99)
POTASSIUM: 3.8 mmol/L (ref 3.5–5.1)
Sodium: 139 mmol/L (ref 135–145)
Total Bilirubin: 0.4 mg/dL (ref 0.3–1.2)
Total Protein: 7.2 g/dL (ref 6.5–8.1)

## 2016-03-11 LAB — TROPONIN I: Troponin I: <0.03 ng/mL (ref <0.03)

## 2016-03-11 LAB — GLUCOSE, CAPILLARY
GLUCOSE-CAPILLARY: 102 mg/dL — AB (ref 65–99)
GLUCOSE-CAPILLARY: 112 mg/dL — AB (ref 65–99)

## 2016-03-11 LAB — URINALYSIS, ROUTINE W REFLEX MICROSCOPIC
BACTERIA UA: NONE SEEN
Bilirubin Urine: NEGATIVE
GLUCOSE, UA: NEGATIVE mg/dL
KETONES UR: NEGATIVE mg/dL
LEUKOCYTES UA: NEGATIVE
NITRITE: NEGATIVE
PH: 5 (ref 5.0–8.0)
Protein, ur: NEGATIVE mg/dL
Specific Gravity, Urine: 1.013 (ref 1.005–1.030)
Squamous Epithelial / LPF: NONE SEEN

## 2016-03-11 LAB — CBC
HCT: 41 % (ref 40.0–52.0)
Hemoglobin: 14.3 g/dL (ref 13.0–18.0)
MCH: 32.4 pg (ref 26.0–34.0)
MCHC: 34.9 g/dL (ref 32.0–36.0)
MCV: 92.8 fL (ref 80.0–100.0)
PLATELETS: 130 10*3/uL — AB (ref 150–440)
RBC: 4.42 MIL/uL (ref 4.40–5.90)
RDW: 13.9 % (ref 11.5–14.5)
WBC: 5 10*3/uL (ref 3.8–10.6)

## 2016-03-11 LAB — ETHANOL: Alcohol, Ethyl (B): 5 mg/dL — ABNORMAL HIGH (ref <5)

## 2016-03-11 MED ORDER — HYDROCODONE-ACETAMINOPHEN 5-325 MG PO TABS: 1.0000 | ORAL_TABLET | ORAL | 0 refills | Status: DC | PRN

## 2016-03-11 MED ORDER — HYDROCODONE-ACETAMINOPHEN 5-325 MG PO TABS
ORAL_TABLET | ORAL | Status: AC
  Administered 2016-03-11: 1 via ORAL
  Filled 2016-03-11: qty 1

## 2016-03-11 MED ORDER — SODIUM CHLORIDE 0.9 % IV BOLUS (SEPSIS)
1000.0000 mL | Freq: Once | INTRAVENOUS | Status: AC
  Administered 2016-03-11: 1000 mL via INTRAVENOUS

## 2016-03-11 MED ORDER — HYDROCODONE-ACETAMINOPHEN 5-325 MG PO TABS
1.0000 | ORAL_TABLET | Freq: Once | ORAL | Status: AC
  Administered 2016-03-11: 1 via ORAL

## 2016-03-11 NOTE — ED Notes (Signed)
Pt. States family reported seizure/syncope episode.  Pt. Reports he has had syncope episodes in the past.  Pt. Denies LOC.  Pt. Reports no injuries from fall.  Pt. Reports chronic back pain and rt. Rib pain.

## 2016-03-11 NOTE — ED Triage Notes (Signed)
Pt. Family reports seizure/syncope like episode.

## 2016-03-11 NOTE — ED Notes (Signed)
Pt. Going home with family. 

## 2016-03-11 NOTE — ED Notes (Signed)
Pt. CBG 112 and repeat check of 102.

## 2016-03-11 NOTE — ED Provider Notes (Signed)
[facility] Emergency Department Provider Note  Time seen: 1:10 AM  I have reviewed the triage vital signs and the nursing notes.   HISTORY  Chief Complaint Loss of Consciousness (Pt. here via EMS for seizures/syncope like episode.)    HPI Connor Marquez is a 61 y.o. male with a past medical history of asthma, arthritis, hypertension, presents to the emergency department with loss of consciousness. According to the patient over the past several months he has been experiencing pain in his right upper abdomen. States he was having some discomfort tonight when he began feeling lightheaded and shortly afterwards passed out. Patient states a history of frequent syncopal episodes last of which occurred he says 2 or 3 months ago. He states a family member stated he was twitching like he was having a seizure. Patient does not recall the twitching but states he remembers feeling lightheaded like he was going to pass out just prior to the event. Denies any chest pain or shortness of breath. Currently the patient states he feels well, has no complaints at this time. Patient does admit alcohol use tonight.  Past Medical History:  Diagnosis Date  . Arthritis   . Asthma   . Back pain   . Hepatitis C   . Hypertension     There are no active problems to display for this patient.   Past Surgical History:  Procedure Laterality Date  . HEMORRHOID SURGERY      Prior to Admission medications   Medication Sig Start Date End Date Taking? Authorizing Provider  guaiFENesin (ROBITUSSIN) 100 MG/5ML SOLN Take 5 mLs (100 mg total) by mouth every 4 (four) hours as needed for cough or to loosen phlegm. 11/12/14   Sharman Cheek, MD  meloxicam (MOBIC) 15 MG tablet Take 1 tablet (15 mg total) by mouth daily. 07/07/15   Chinita Pester, FNP  predniSONE (DELTASONE) 10 MG tablet Take 6 tablets (60 mg total) by mouth daily. Take 6 tablets x 3 days Take 5 tablets x 3 days Take 4 tablets x  3 days Take 3 tablets x 3 days Take 2 tablets x 3 days Take 1 tablet x 3 days 08/29/14   Chinita Pester, FNP  sulfamethoxazole-trimethoprim (BACTRIM DS,SEPTRA DS) 800-160 MG tablet Take 1 tablet by mouth 2 (two) times daily. 07/07/15   Chinita Pester, FNP  traMADol (ULTRAM) 50 MG tablet Take 1 tablet (50 mg total) by mouth every 6 (six) hours as needed. 07/07/15   Chinita Pester, FNP    No Known Allergies  History reviewed. No pertinent family history.  Social History Social History  Substance Use Topics  . Smoking status: Current Every Day Smoker    Packs/day: 1.00    Types: Cigarettes  . Smokeless tobacco: Never Used  . Alcohol use Yes     Comment: occasionally    Review of Systems Constitutional: Negative for fever.Loss of consciousness Cardiovascular: Negative for chest pain. Respiratory: Negative for shortness of breath. Gastrointestinal: Some right upper quadrant pain which the patient states has been intermittent for months. Neurological: Negative for headaches, focal weakness or numbness. 10-point ROS otherwise negative.  ____________________________________________   PHYSICAL EXAM:  VITAL SIGNS: ED Triage Vitals [03/11/16 0053]  Enc Vitals Group     BP (!) 148/92     Pulse Rate (!) 58     Resp 15     Temp 98.2 F (36.8 C)     Temp Source Oral     SpO2 97 %  Weight 190 lb (86.2 kg)     Height 6' (1.829 m)     Head Circumference      Peak Flow      Pain Score      Pain Loc      Pain Edu?      Excl. in GC?     Constitutional: Alert and oriented. Well appearing and in no distress. Eyes: Normal exam ENT   Head: Normocephalic and atraumatic.   Mouth/Throat: Mucous membranes are moist. Cardiovascular: Normal rate, regular rhythm. No murmur Respiratory: Normal respiratory effort without tachypnea nor retractions. Breath sounds are clear  Gastrointestinal: Soft and nontender. No distention. Musculoskeletal: Nontender with normal range of motion  in all extremities.  Neurologic:  Normal speech and language. No gross focal neurologic deficits Skin:  Skin is warm, dry and intact.  Psychiatric: Mood and affect are normal.   ____________________________________________    EKG  EKG reviewed and interpreted by myself shows normal sinus rhythm at 59 bpm, narrow QRS, normal axis, normal intervals, no concerning ST changes noted.  ____________________________________________    RADIOLOGY  Ultrasound largely negative.  ____________________________________________   INITIAL IMPRESSION / ASSESSMENT AND PLAN / ED COURSE  Pertinent labs & imaging results that were available during my care of the patient were reviewed by me and considered in my medical decision making (see chart for details).  The patient presents to the emergency department after suffering a loss of consciousness syncope versus seizure. Family describes the patient passing out and then twitching and then awakening. Patient states he recalls feeling like he was going to pass out. Patient states multiple syncopal episodes in the past last of which occurred 2 or 3 months ago per patient. Does complain of intermittent right upper quadrant pain, we'll obtain lab work including troponin, LFTs and lipase. Patient does admit alcohol use tonight we'll obtain an ethanol level as well. We will IV hydrate while awaiting results. Given the patient's prodrome of feeling lightheaded like he is going to pass out, highly suspect syncope over seizure.  Ultrasound largely negative. We will discharge the short course of pain medication. Patient denies any further symptoms since arriving at the emergency department. ____________________________________________   FINAL CLINICAL IMPRESSION(S) / ED DIAGNOSES  Syncope    Minna AntisKevin Gloyd Happ, MD 03/11/16 [phone]

## 2016-12-31 ENCOUNTER — Emergency Department
Admission: EM | Admit: 2016-12-31 | Discharge: 2016-12-31 | Disposition: A | Discharge status: Home / Self Care | Payer: Self-pay | Attending: Emergency Medicine | Admitting: Emergency Medicine

## 2016-12-31 ENCOUNTER — Encounter: Payer: Self-pay | Admitting: Emergency Medicine

## 2016-12-31 ENCOUNTER — Other Ambulatory Visit: Payer: Self-pay

## 2016-12-31 DIAGNOSIS — R3915 Urgency of urination: Secondary | ICD-10-CM | POA: Insufficient documentation

## 2016-12-31 DIAGNOSIS — J45909 Unspecified asthma, uncomplicated: Secondary | ICD-10-CM | POA: Insufficient documentation

## 2016-12-31 DIAGNOSIS — I10 Essential (primary) hypertension: Secondary | ICD-10-CM | POA: Insufficient documentation

## 2016-12-31 DIAGNOSIS — J029 Acute pharyngitis, unspecified: Secondary | ICD-10-CM | POA: Insufficient documentation

## 2016-12-31 DIAGNOSIS — F17210 Nicotine dependence, cigarettes, uncomplicated: Secondary | ICD-10-CM | POA: Insufficient documentation

## 2016-12-31 LAB — URINALYSIS, COMPLETE (UACMP) WITH MICROSCOPIC
BILIRUBIN URINE: NEGATIVE
Bacteria, UA: NONE SEEN
Glucose, UA: NEGATIVE mg/dL
Ketones, ur: NEGATIVE mg/dL
LEUKOCYTES UA: NEGATIVE
Nitrite: NEGATIVE
PH: 5 (ref 5.0–8.0)
Protein, ur: NEGATIVE mg/dL
SPECIFIC GRAVITY, URINE: 1.014 (ref 1.005–1.030)
SQUAMOUS EPITHELIAL / LPF: NONE SEEN

## 2016-12-31 LAB — POCT RAPID STREP A: STREPTOCOCCUS, GROUP A SCREEN (DIRECT): NEGATIVE

## 2016-12-31 MED ORDER — LIDOCAINE VISCOUS 2 % MT SOLN: 10.0000 mL | OROMUCOSAL | 0 refills | Status: DC | PRN

## 2016-12-31 NOTE — ED Triage Notes (Signed)
Pt presents to ED c/o sore throat x1 week.

## 2016-12-31 NOTE — ED Notes (Signed)
See triage note  Presents with a sore throat and nasal congestion which started about 1 week ago  No fever  But states throat pain is worse with swallowing  Also states is having some urinary freq which has been going on for a long time

## 2016-12-31 NOTE — ED Notes (Signed)

## 2016-12-31 NOTE — ED Notes (Signed)
FIRST NURSE NOTE:  Pt presents to lobby with c/o throat feeling "raw"

## 2016-12-31 NOTE — ED Provider Notes (Signed)
[facility] Emergency Department Provider Note  ____________________________________________  Time seen: Approximately 1:58 PM  I have reviewed the triage vital signs and the nursing notes.   HISTORY  Chief Complaint Sore Throat    HPI Connor Marquez is a 61 y.o. male that presents to the emergency department for evaluation of sore throat for 5 days.  Patient states that he also had urinary urgency "for a long time." He is unable to quantify how long. Sometimes when he has to urinate, he has to try to run to get to the bathroom in time.  He usually drinks a cup of coffee every 3 weeks but has had more in the last week.  He denies nasal congestion, cough, shortness of breath, chest pain, nausea, vomiting, abdominal pain, back pain, dysuria, frequency.   Past Medical History:  Diagnosis Date  . Arthritis   . Asthma   . Back pain   . Hepatitis C   . Hypertension     There are no active problems to display for this patient.   Past Surgical History:  Procedure Laterality Date  . HEMORRHOID SURGERY      Prior to Admission medications   Medication Sig Start Date End Date Taking? Authorizing Provider  guaiFENesin (ROBITUSSIN) 100 MG/5ML SOLN Take 5 mLs (100 mg total) by mouth every 4 (four) hours as needed for cough or to loosen phlegm. Patient not taking: Reported on 03/11/2016 11/12/14   Sharman Cheek, MD  HYDROcodone-acetaminophen (NORCO/VICODIN) 5-325 MG tablet Take 1 tablet by mouth every 4 (four) hours as needed. 03/11/16   Minna Antis, MD  lidocaine (XYLOCAINE) 2 % solution Use as directed 10 mLs in the mouth or throat as needed for mouth pain. 12/31/16   Enid Derry, PA-C  meloxicam (MOBIC) 15 MG tablet Take 1 tablet (15 mg total) by mouth daily. Patient not taking: Reported on 03/11/2016 07/07/15   Kem Boroughs B, FNP  predniSONE (DELTASONE) 10 MG tablet Take 6 tablets (60 mg total) by mouth daily. Take 6 tablets x 3 days Take 5  tablets x 3 days Take 4 tablets x 3 days Take 3 tablets x 3 days Take 2 tablets x 3 days Take 1 tablet x 3 days Patient not taking: Reported on 03/11/2016 08/29/14   Kem Boroughs B, FNP  sulfamethoxazole-trimethoprim (BACTRIM DS,SEPTRA DS) 800-160 MG tablet Take 1 tablet by mouth 2 (two) times daily. Patient not taking: Reported on 03/11/2016 07/07/15   Kem Boroughs B, FNP  traMADol (ULTRAM) 50 MG tablet Take 1 tablet (50 mg total) by mouth every 6 (six) hours as needed. Patient not taking: Reported on 03/11/2016 07/07/15   Chinita Pester, FNP    Allergies Patient has no known allergies.  History reviewed. No pertinent family history.  Social History Social History   Tobacco Use  . Smoking status: Current Some Day Smoker    Packs/day: 0.50    Types: Cigarettes  . Smokeless tobacco: Never Used  Substance Use Topics  . Alcohol use: Yes    Comment: occasionally  . Drug use: Yes    Frequency: 3.0 times per week    Types: Marijuana     Review of Systems  Constitutional: No fever/chills Cardiovascular: No chest pain. Respiratory: No cough. No SOB. Gastrointestinal: No abdominal pain.  No nausea, no vomiting.  Genitourinary: Negative for dysuria. Musculoskeletal: Negative for musculoskeletal pain. Skin: Negative for rash, abrasions, lacerations, ecchymosis.   ____________________________________________   PHYSICAL EXAM:  VITAL SIGNS: ED Triage Vitals  Enc Vitals Group     BP 12/31/16 1227 (!) 153/97     Pulse --      Resp 12/31/16 1227 16     Temp 12/31/16 1227 98.4 F (36.9 C)     Temp Source 12/31/16 1227 Oral     SpO2 12/31/16 1227 97 %     Weight 12/31/16 1227 220 lb (99.8 kg)     Height 12/31/16 1227 6' (1.829 m)     Head Circumference --      Peak Flow --      Pain Score 12/31/16 1230 9     Pain Loc --      Pain Edu? --      Excl. in GC? --      Constitutional: Alert and oriented. Well appearing and in no acute distress. Eyes: Conjunctivae are  normal. PERRL. EOMI. Head: Atraumatic. ENT:      Ears: Tympanic membranes are pearly.      Nose: No congestion/rhinnorhea.      Mouth/Throat: Mucous membranes are moist.  Oropharynx nonerythematous.  Tonsils not enlarged.  Uvula midline. Neck: No stridor.  Cardiovascular: Normal rate, regular rhythm.  Good peripheral circulation. Respiratory: Normal respiratory effort without tachypnea or retractions. Lungs CTAB. Good air entry to the bases with no decreased or absent breath sounds. Gastrointestinal: Bowel sounds 4 quadrants. Soft and nontender to palpation. No guarding or rigidity. No palpable masses. No distention. No CVA tenderness. Musculoskeletal: Full range of motion to all extremities. No gross deformities appreciated. Neurologic:  Normal speech and language. No gross focal neurologic deficits are appreciated.  Skin:  Skin is warm, dry and intact. No rash noted.   ____________________________________________   LABS (all labs ordered are listed, but only abnormal results are displayed)  Labs Reviewed  URINALYSIS, COMPLETE (UACMP) WITH MICROSCOPIC - Abnormal; Notable for the following components:      Result Value   Color, Urine YELLOW (*)    APPearance CLEAR (*)    Hgb urine dipstick SMALL (*)    All other components within normal limits  POCT RAPID STREP A   ____________________________________________  EKG   ____________________________________________  RADIOLOGY   No results found.  ____________________________________________    PROCEDURES  Procedure(s) performed:    Procedures    Medications - No data to display   ____________________________________________   INITIAL IMPRESSION / ASSESSMENT AND PLAN / ED COURSE  Pertinent labs & imaging results that were available during my care of the patient were reviewed by me and considered in my medical decision making (see chart for details).  Review of the [place] CSRS was performed in accordance of the  [facility] prior to dispensing any controlled drugs.  Patient's diagnosis is consistent with viral pharyngitis.  Vital signs and exam are reassuring.  Strep test negative. Throat is non-erythematous and tonsils are not enlarged. Patient also has had urinary urgency for "a long time." Urinalysis negative for infection. Patient will follow up with PCP for further workup. Patient will be discharged home with prescriptions for viscous lidocaine. Patient is to follow up with PCP as directed. Patient is given ED precautions to return to the ED for any worsening or new symptoms.     ____________________________________________  FINAL CLINICAL IMPRESSION(S) / ED DIAGNOSES  Final diagnoses:  Pharyngitis, unspecified etiology  Urinary urgency      NEW MEDICATIONS STARTED DURING THIS VISIT:  ED Discharge Orders        Ordered    lidocaine (XYLOCAINE) 2 % solution  As  needed     12/31/16 1445          This chart was dictated using voice recognition software/Dragon. Despite best efforts to proofread, errors can occur which can change the meaning. Any change was purely unintentional.    Enid DerryWagner, Kaylynn Chamblin, PA-C 12/31/16 1535    Darci CurrentBrown,  N, MD 01/01/17 [phone]

## 2017-08-10 IMAGING — DX DG ELBOW COMPLETE 3+V*L*
4 series · 4 of 4 positions shown · non-contrast
Comparison: None.

CLINICAL DATA: Left elbow pain and swelling greater than 3 days
ago, worsened.

EXAM:
LEFT ELBOW - COMPLETE 3+ VIEW

[elbow ap]
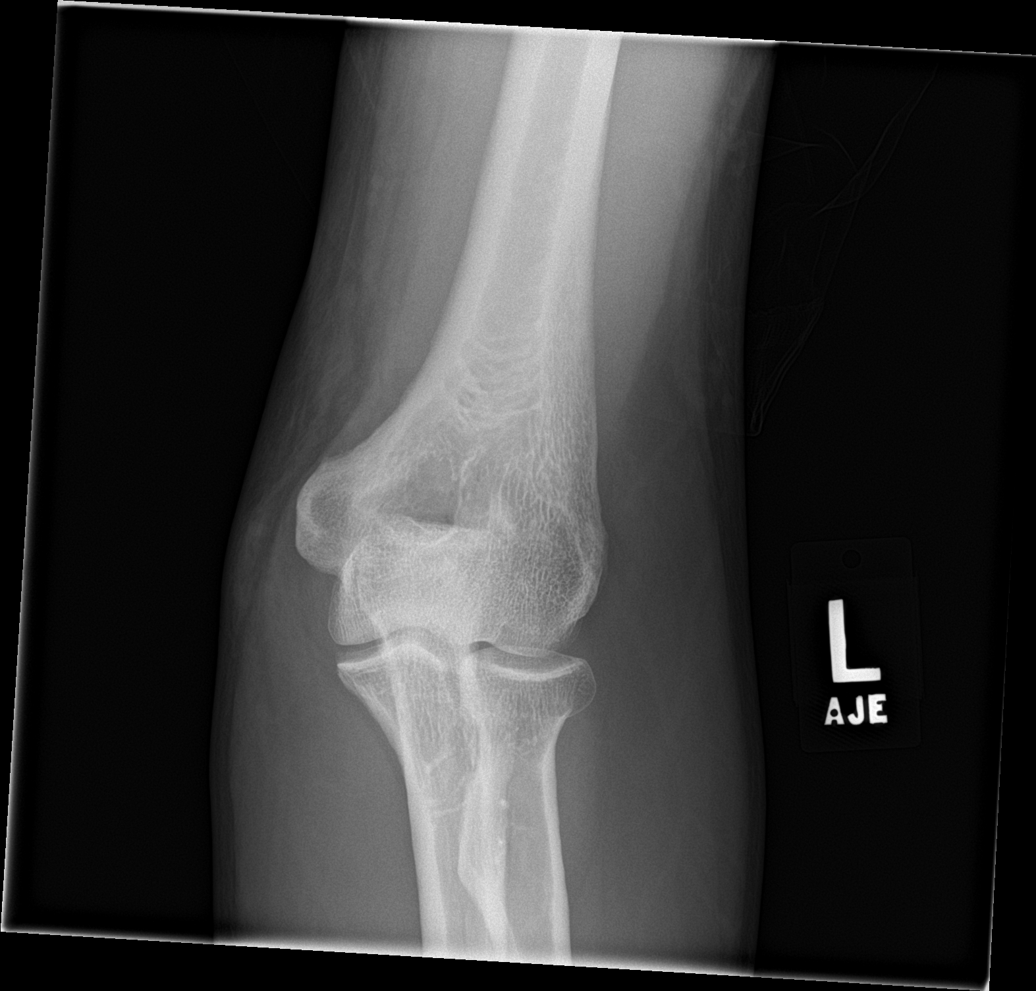

[elbow obl (1 of 2)]
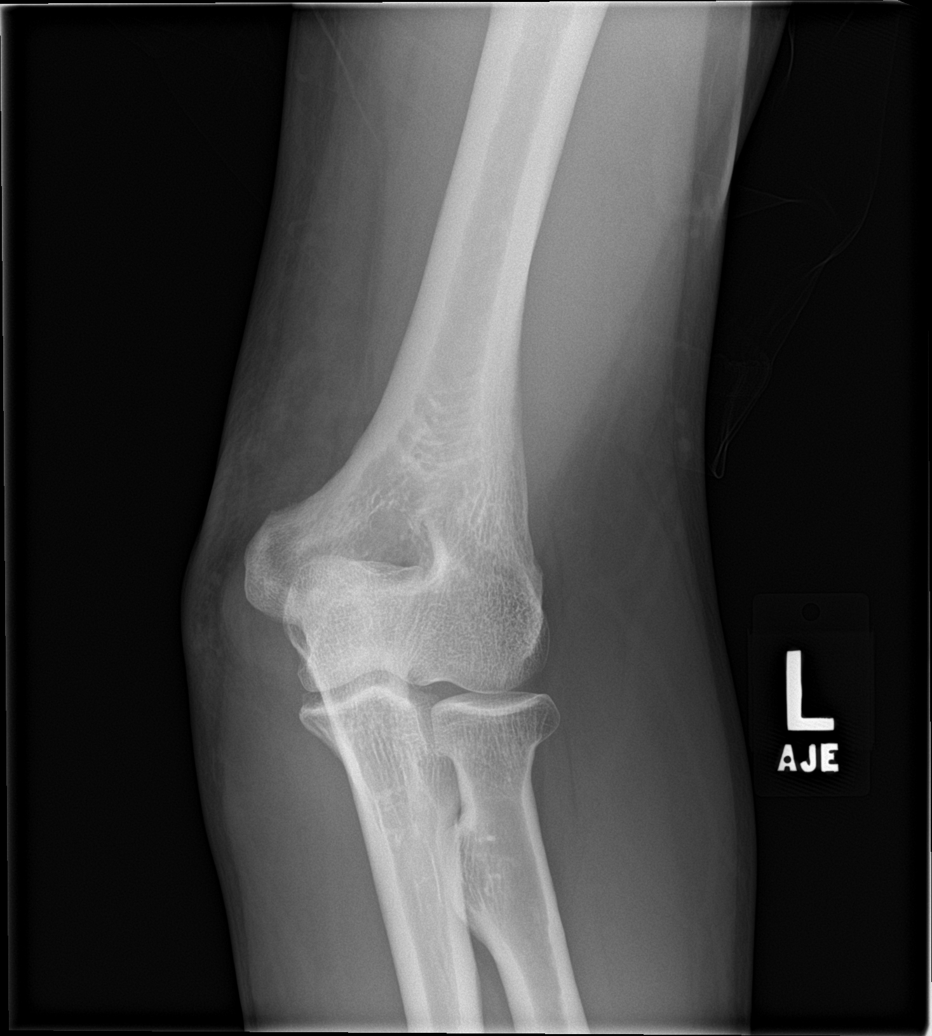

[elbow obl (2 of 2)]
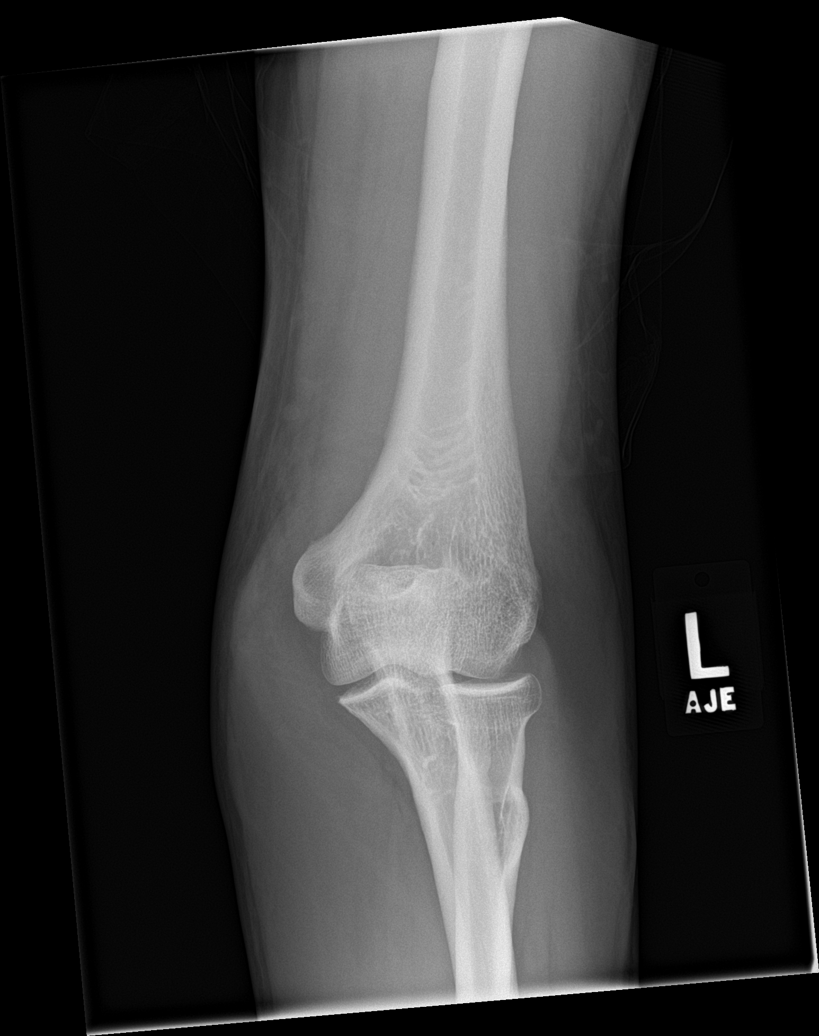

[elbow lat]
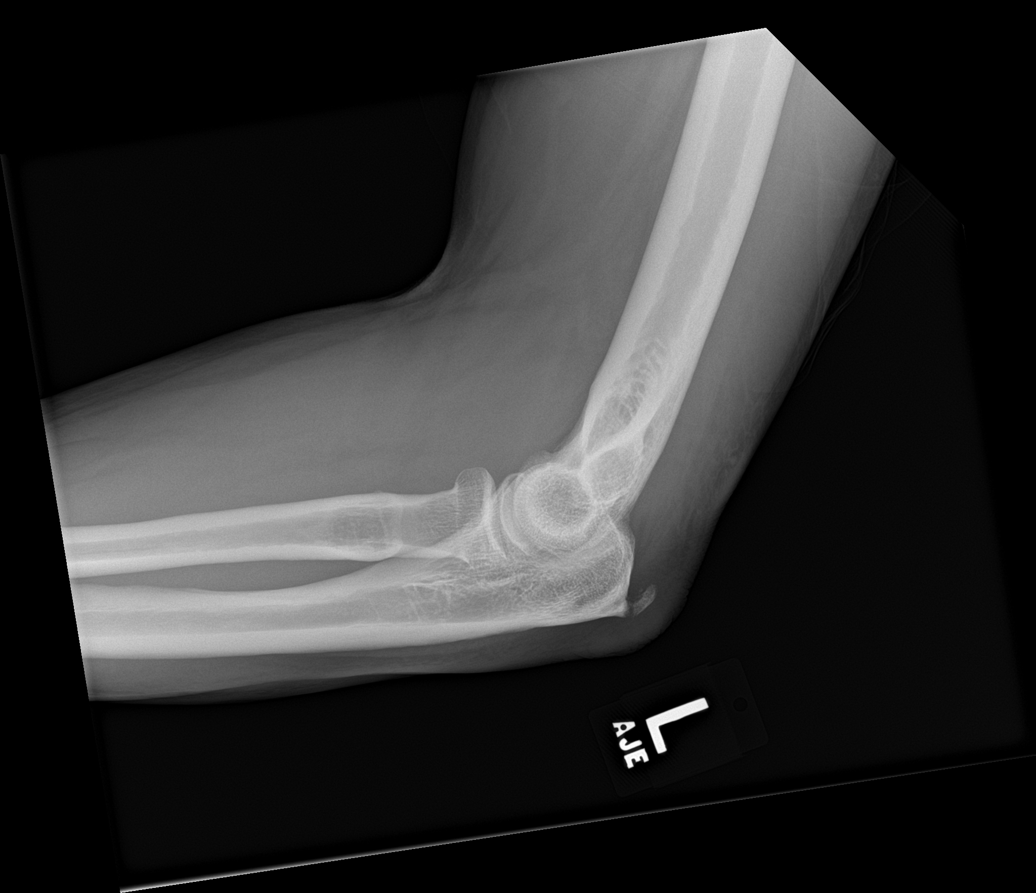

[4 of 4 positions shown; findings below may reference images not displayed]

FINDINGS: Abnormal soft tissue swelling overlying the olecranon. There is a
fragmented spur of the olecranon and this fragmentation could be
acute given the poor definition of cortical margins along its
proximal edge. The overlying olecranon bursitis not excluded.

No elbow joint effusion. Supinator fat pad reasonably unremarkable.
No other findings concerning for fracture.
IMPRESSION: 1. Soft tissue swelling overlying the olecranon, possible olecranon
bursitis.
2. Fragmented spur of the olecranon - the fragmentation of this per
could be acute given the appearance. Triceps injury not excluded,
correlate with triceps strength.

## 2017-12-30 ENCOUNTER — Other Ambulatory Visit: Payer: Self-pay

## 2017-12-30 ENCOUNTER — Emergency Department
Admission: EM | Admit: 2017-12-30 | Discharge: 2017-12-30 | Disposition: A | Discharge status: Home / Self Care | Payer: Self-pay | Attending: Emergency Medicine | Admitting: Emergency Medicine

## 2017-12-30 DIAGNOSIS — K0889 Other specified disorders of teeth and supporting structures: Secondary | ICD-10-CM | POA: Insufficient documentation

## 2017-12-30 DIAGNOSIS — I10 Essential (primary) hypertension: Secondary | ICD-10-CM | POA: Insufficient documentation

## 2017-12-30 DIAGNOSIS — F17210 Nicotine dependence, cigarettes, uncomplicated: Secondary | ICD-10-CM | POA: Insufficient documentation

## 2017-12-30 DIAGNOSIS — J45909 Unspecified asthma, uncomplicated: Secondary | ICD-10-CM | POA: Insufficient documentation

## 2017-12-30 MED ORDER — TRAMADOL HCL 50 MG PO TABS: 50.0000 mg | ORAL_TABLET | Freq: Four times a day (QID) | ORAL | 0 refills | Status: AC | PRN

## 2017-12-30 MED ORDER — TRAMADOL HCL 50 MG PO TABS
50.0000 mg | ORAL_TABLET | Freq: Once | ORAL | Status: AC
  Administered 2017-12-30: 50 mg via ORAL
  Filled 2017-12-30: qty 1

## 2017-12-30 MED ORDER — PENICILLIN V POTASSIUM 500 MG PO TABS
500.0000 mg | ORAL_TABLET | Freq: Once | ORAL | Status: AC
  Administered 2017-12-30: 500 mg via ORAL
  Filled 2017-12-30: qty 1

## 2017-12-30 MED ORDER — PENICILLIN V POTASSIUM 250 MG PO TABS: 250.0000 mg | ORAL_TABLET | Freq: Four times a day (QID) | ORAL | 0 refills | Status: AC

## 2017-12-30 NOTE — ED Provider Notes (Signed)
[facility] Emergency Department Provider Note   ____________________________________________    I have reviewed the triage vital signs and the nursing notes.   HISTORY  Chief Complaint Dental Pain     HPI Connor Marquez is a 62 y.o. male who presents with complaints with left posterior jaw pain, patient  reports the pain is moderate to severe, has not improved with Motrin or Aleve.  No fevers or chills.  Describes the pain as throbbing in nature.  No difficulty swallowing, no intraoral swelling.  He thinks he may have a dental infection   Past Medical History:  Diagnosis Date  . Arthritis   . Asthma   . Back pain   . Hepatitis C   . Hypertension     There are no active problems to display for this patient.   Past Surgical History:  Procedure Laterality Date  . HEMORRHOID SURGERY      Prior to Admission medications   Medication Sig Start Date End Date Taking? Authorizing Provider  penicillin v potassium (VEETID) 250 MG tablet Take 1 tablet (250 mg total) by mouth 4 (four) times daily. 12/30/17   Jene Every, MD  traMADol (ULTRAM) 50 MG tablet Take 1 tablet (50 mg total) by mouth every 6 (six) hours as needed. 12/30/17 12/30/18  Jene Every, MD     Allergies Patient has no known allergies.  History reviewed. No pertinent family history.  Social History Social History   Tobacco Use  . Smoking status: Current Some Day Smoker    Packs/day: 0.50    Types: Cigarettes  . Smokeless tobacco: Never Used  Substance Use Topics  . Alcohol use: Yes    Comment: occasionally  . Drug use: Yes    Frequency: 3.0 times per week    Types: Marijuana    Review of Systems  Constitutional: No fever/chills  ENT: No intraoral swelling     Skin: Negative for rash. Neurological: Negative for headaches     ____________________________________________   PHYSICAL EXAM:  VITAL SIGNS: ED Triage Vitals  Enc Vitals Group     BP  12/30/17 1750 (!) 159/109     Pulse Rate 12/30/17 1750 68     Resp 12/30/17 1750 16     Temp 12/30/17 1750 97.9 F (36.6 C)     Temp Source 12/30/17 1750 Oral     SpO2 12/30/17 1750 97 %     Weight 12/30/17 1751 90.7 kg (200 lb)     Height 12/30/17 1751 1.829 m (6')     Head Circumference --      Peak Flow --      Pain Score 12/30/17 1750 9     Pain Loc --      Pain Edu? --      Excl. in GC? --      Constitutional: Alert and oriented. No acute distress.  Eyes: Conjunctivae are normal.   Nose: No congestion/rhinnorhea. Mouth/Throat: Mucous membranes are moist.  No intraoral swelling, left posterior molar mild tenderness, mild erythema, no abscess Cardiovascular: Normal rate, regular rhythm.   Musculoskeletal: No lower extremity tenderness nor edema.   Neurologic:  Normal speech and language.  Skin:  Skin is warm, dry and intact. No rash noted.   ____________________________________________   LABS (all labs ordered are listed, but only abnormal results are displayed)  Labs Reviewed - No data to display ____________________________________________  EKG   ____________________________________________  RADIOLOGY  None ____________________________________________   PROCEDURES  Procedure(s) performed:  No  Procedures   Critical Care performed: No ____________________________________________   INITIAL IMPRESSION / ASSESSMENT AND PLAN / ED COURSE  Pertinent labs & imaging results that were available during my care of the patient were reviewed by me and considered in my medical decision making (see chart for details).  Exam reassuring, suspect mild dental infection, will start on antibiotics, analgesics, recommend dental follow-up   ____________________________________________   FINAL CLINICAL IMPRESSION(S) / ED DIAGNOSES  Final diagnoses:  Pain, dental      NEW MEDICATIONS STARTED DURING THIS VISIT:  Discharge Medication List as of 12/30/2017  7:45  PM    START taking these medications   Details  penicillin v potassium (VEETID) 250 MG tablet Take 1 tablet (250 mg total) by mouth 4 (four) times daily., Starting Sun 12/30/2017, Print         Note:  This document was prepared using Dragon voice recognition software and may include unintentional dictation errors.    Jene EveryKinner, Georgeann Brinkman, MD 12/30/17 2135

## 2017-12-30 NOTE — ED Triage Notes (Signed)
L lower dental pain. Believes he has abscess. Some swelling noted but not much. Talking in complete sentences. A&O, ambulatory.

## 2017-12-30 NOTE — ED Notes (Signed)
Pt reports left sided dental pain (top) that started about 2-3 days ago. Gradually getting worse. Throbbing and painful.

## 2018-03-29 ENCOUNTER — Other Ambulatory Visit: Payer: Self-pay

## 2018-03-29 ENCOUNTER — Emergency Department
Admission: EM | Admit: 2018-03-29 | Discharge: 2018-03-29 | Disposition: A | Discharge status: Home / Self Care | Payer: Self-pay | Attending: Emergency Medicine | Admitting: Emergency Medicine

## 2018-03-29 DIAGNOSIS — F1010 Alcohol abuse, uncomplicated: Secondary | ICD-10-CM | POA: Insufficient documentation

## 2018-03-29 DIAGNOSIS — J45909 Unspecified asthma, uncomplicated: Secondary | ICD-10-CM | POA: Insufficient documentation

## 2018-03-29 DIAGNOSIS — I10 Essential (primary) hypertension: Secondary | ICD-10-CM | POA: Insufficient documentation

## 2018-03-29 DIAGNOSIS — F17210 Nicotine dependence, cigarettes, uncomplicated: Secondary | ICD-10-CM | POA: Insufficient documentation

## 2018-03-29 DIAGNOSIS — F14220 Cocaine dependence with intoxication, uncomplicated: Secondary | ICD-10-CM

## 2018-03-29 DIAGNOSIS — F14229 Cocaine dependence with intoxication, unspecified: Secondary | ICD-10-CM | POA: Insufficient documentation

## 2018-03-29 LAB — COMPREHENSIVE METABOLIC PANEL
ALK PHOS: 173 U/L — AB (ref 38–126)
ALT: 83 U/L — AB (ref 0–44)
AST: 180 U/L — ABNORMAL HIGH (ref 15–41)
Albumin: 3.3 g/dL — ABNORMAL LOW (ref 3.5–5.0)
Anion gap: 10 (ref 5–15)
BILIRUBIN TOTAL: 1.3 mg/dL — AB (ref 0.3–1.2)
BUN: 11 mg/dL (ref 8–23)
CALCIUM: 8.4 mg/dL — AB (ref 8.9–10.3)
CO2: 23 mmol/L (ref 22–32)
CREATININE: 0.63 mg/dL (ref 0.61–1.24)
Chloride: 106 mmol/L (ref 98–111)
Glucose, Bld: 131 mg/dL — ABNORMAL HIGH (ref 70–99)
Potassium: 3.3 mmol/L — ABNORMAL LOW (ref 3.5–5.1)
Sodium: 139 mmol/L (ref 135–145)
Total Protein: 8 g/dL (ref 6.5–8.1)

## 2018-03-29 LAB — CBC
HCT: 43 % (ref 39.0–52.0)
Hemoglobin: 14.8 g/dL (ref 13.0–17.0)
MCH: 33.2 pg (ref 26.0–34.0)
MCHC: 34.4 g/dL (ref 30.0–36.0)
MCV: 96.4 fL (ref 80.0–100.0)
Platelets: 101 10*3/uL — ABNORMAL LOW (ref 150–400)
RBC: 4.46 MIL/uL (ref 4.22–5.81)
RDW: 13.7 % (ref 11.5–15.5)
WBC: 5.5 10*3/uL (ref 4.0–10.5)
nRBC: 0 % (ref 0.0–0.2)

## 2018-03-29 LAB — URINE DRUG SCREEN, QUALITATIVE (ARMC ONLY)
Amphetamines, Ur Screen: NOT DETECTED
BARBITURATES, UR SCREEN: NOT DETECTED
BENZODIAZEPINE, UR SCRN: NOT DETECTED
CANNABINOID 50 NG, UR ~~LOC~~: NOT DETECTED
Cocaine Metabolite,Ur ~~LOC~~: POSITIVE — AB
MDMA (Ecstasy)Ur Screen: NOT DETECTED
METHADONE SCREEN, URINE: NOT DETECTED
OPIATE, UR SCREEN: NOT DETECTED
Phencyclidine (PCP) Ur S: NOT DETECTED
TRICYCLIC, UR SCREEN: NOT DETECTED

## 2018-03-29 LAB — ETHANOL: ALCOHOL ETHYL (B): 129 mg/dL — AB (ref <10)

## 2018-03-29 NOTE — ED Triage Notes (Signed)
Pt states "i'm fucked up" states cocaine use last night. Drank alcohol today. Denies SI. Denies HI. Pt with steady gait, no slurred speech noted. A&O, ambulatory. Calm, cooperative. Pt states "is there a shot they give you to level you out?"

## 2018-03-29 NOTE — ED Notes (Addendum)
ED Provider at bedside. Patient denies any abd pain. Reports drinking, using cocaine last night, and wants to feel better. Patient has chronic back pain. No slurred speech. Steady gait

## 2018-03-29 NOTE — BH Assessment (Signed)
Assessment Note  Connor Marquez is an 63 y.o. male who presents to the ER due to having concerns about his health after using cocaine and alcohol. Patient states he has used in the past but this is the first time he was unable to "shake." He used last night (03/28/2018). Patient further reports, he used different drug dealer and it may be why his reaction was different. Patient requests to have, "something like what they give heroin users. You know, the shot thing? I need to get one for the cocaine." After writer informed him what he was requesting didn't exist, he requested to leave.  During the interview, the patient was calm, cooperative and pleasant. He was able to provide appropriate answers to the questions. Several times during the interview, patient was asked about SI/HI and AV/H and each time he denied it.   Diagnosis: Substance Use Disorder  Past Medical History:  Past Medical History:  Diagnosis Date  . Arthritis   . Asthma   . Back pain   . Hepatitis C   . Hypertension     Past Surgical History:  Procedure Laterality Date  . HEMORRHOID SURGERY      Family History: History reviewed. No pertinent family history.  Social History:  reports that he has been smoking cigarettes. He has been smoking about 0.50 packs per day. He has never used smokeless tobacco. He reports current alcohol use. He reports current drug use. Frequency: 3.00 times per week. Drugs: Marijuana and Cocaine.  Additional Social History:  Alcohol / Drug Use Pain Medications: Reports of none Prescriptions: Reports of none Over the Counter: Reports of none History of alcohol / drug use?: Yes Longest period of sobriety (when/how long): Unable to quantify Negative Consequences of Use: Personal relationships Substance #1 Name of Substance 1: Alcohol Substance #2 Name of Substance 2: Cocaine  CIWA: CIWA-Ar BP: (!) 153/94 Pulse Rate: 75 COWS:    Allergies: No Known Allergies  Home Medications: (Not  in a hospital admission)   OB/GYN Status:  No LMP for male patient.  General Assessment Data Location of Assessment: [facility] ED TTS Assessment: In system Is this a Tele or Face-to-Face Assessment?: Face-to-Face Is this an Initial Assessment or a Re-assessment for this encounter?: Initial Assessment Patient Accompanied by:: N/A Language Other than English: No Living Arrangements: Other (Comment)(Private Home) What gender do you identify as?: Male Marital status: Single Pregnancy Status: No Living Arrangements: Alone Can pt return to current living arrangement?: Yes Admission Status: Voluntary Is patient capable of signing voluntary admission?: Yes Referral Source: Self/Family/Friend Insurance type: None  Medical Screening Exam [facility] Walk-in ONLY) Medical Exam completed: Yes  Crisis Care Plan Living Arrangements: Alone Legal Guardian: Other:(Self) Name of Psychiatrist: Reports of none Name of Therapist: Reports of none  Education Status Is patient currently in school?: No Is the patient employed, unemployed or receiving disability?: Unemployed  Risk to self with the past 6 months Suicidal Ideation: No Has patient been a risk to self within the past 6 months prior to admission? : No Suicidal Intent: No Has patient had any suicidal intent within the past 6 months prior to admission? : No Is patient at risk for suicide?: No Suicidal Plan?: No Has patient had any suicidal plan within the past 6 months prior to admission? : No Access to Means: No What has been your use of drugs/alcohol within the last 12 months?: Alcohol & Cocaine Previous Attempts/Gestures: No How many times?: 0 Other Self Harm Risks: Drug use Triggers  for Past Attempts: None known Intentional Self Injurious Behavior: None Family Suicide History: Unknown Recent stressful life event(s): Other (Comment)(Drug use) Persecutory voices/beliefs?: No Depression: No Depression Symptoms: Guilt Substance abuse  history and/or treatment for substance abuse?: No Suicide prevention information given to non-admitted patients: Not applicable  Risk to Others within the past 6 months Homicidal Ideation: No Does patient have any lifetime risk of violence toward others beyond the six months prior to admission? : No Thoughts of Harm to Others: No Current Homicidal Intent: No Current Homicidal Plan: No Access to Homicidal Means: No Identified Victim: Reports of none History of harm to others?: No Assessment of Violence: None Noted Violent Behavior Description: Reports of none Does patient have access to weapons?: No Criminal Charges Pending?: No Does patient have a court date: No Is patient on probation?: No  Psychosis Hallucinations: None noted Delusions: None noted  Mental Status Report Appearance/Hygiene: Unremarkable Eye Contact: Good Motor Activity: Freedom of movement, Unremarkable Speech: Logical/coherent, Unremarkable Level of Consciousness: Alert Mood: Pleasant Affect: Appropriate to circumstance Anxiety Level: None Thought Processes: Coherent, Relevant Judgement: Unimpaired Orientation: Person, Place, Time, Situation, Appropriate for developmental age Obsessive Compulsive Thoughts/Behaviors: Minimal  Cognitive Functioning Concentration: Normal Memory: Recent Intact, Remote Intact Is patient IDD: No Insight: Fair Impulse Control: Fair Appetite: Good Have you had any weight changes? : No Change Sleep: No Change Total Hours of Sleep: 8 Vegetative Symptoms: None  ADLScreening [facility] Assessment Services) Patient's cognitive ability adequate to safely complete daily activities?: Yes Patient able to express need for assistance with ADLs?: Yes Independently performs ADLs?: Yes (appropriate for developmental age)  Prior Inpatient Therapy Prior Inpatient Therapy: No  Prior Outpatient Therapy Prior Outpatient Therapy: No Does patient have an ACCT team?: No Does patient have  Intensive In-House Services?  : No Does patient have [facility] services? : No Does patient have P4CC services?: No  ADL Screening (condition at time of admission) Patient's cognitive ability adequate to safely complete daily activities?: Yes Is the patient deaf or have difficulty hearing?: No Does the patient have difficulty seeing, even when wearing glasses/contacts?: No Does the patient have difficulty concentrating, remembering, or making decisions?: No Patient able to express need for assistance with ADLs?: Yes Does the patient have difficulty dressing or bathing?: No Independently performs ADLs?: Yes (appropriate for developmental age) Does the patient have difficulty walking or climbing stairs?: No Weakness of Legs: None Weakness of Arms/Hands: None  Home Assistive Devices/Equipment Home Assistive Devices/Equipment: None  Therapy Consults (therapy consults require a physician order) PT Evaluation Needed: No OT Evalulation Needed: No SLP Evaluation Needed: No Abuse/Neglect Assessment (Assessment to be complete while patient is alone) Abuse/Neglect Assessment Can Be Completed: Yes Physical Abuse: Denies Verbal Abuse: Denies Sexual Abuse: Denies Exploitation of patient/patient's resources: Denies Self-Neglect: Denies Values / Beliefs Cultural Requests During Hospitalization: None Spiritual Requests During Hospitalization: None Consults Spiritual Care Consult Needed: No Social Work Consult Needed: No Merchant navy officer (For Healthcare) Does Patient Have a Medical Advance Directive?: No Would patient like information on creating a medical advance directive?: No - Patient declined       Child/Adolescent Assessment Running Away Risk: Denies(Patient is an adult)  Disposition:  Disposition Initial Assessment Completed for this Encounter: Yes  On Site Evaluation by:   Reviewed with Physician:    Lilyan Gilford MS, LCAS, LPMHCA, NCC, CCSI Therapeutic Triage  Specialist 03/29/2018 5:57 PM

## 2018-03-29 NOTE — ED Provider Notes (Signed)
[facility] Emergency Department Provider Note   ____________________________________________   First MD Initiated Contact with Patient 03/29/18 1507     (approximate)  I have reviewed the triage vital signs and the nursing notes.   HISTORY  Chief Complaint Drug / Alcohol Assessment    HPI Connor Marquez is a 63 y.o. male who reports he has been doing alcohol and cocaine he wants some help getting off of them.         Past Medical History:  Diagnosis Date  . Arthritis   . Asthma   . Back pain   . Hepatitis C   . Hypertension     There are no active problems to display for this patient.   Past Surgical History:  Procedure Laterality Date  . HEMORRHOID SURGERY      Prior to Admission medications   Medication Sig Start Date End Date Taking? Authorizing Provider  penicillin v potassium (VEETID) 250 MG tablet Take 1 tablet (250 mg total) by mouth 4 (four) times daily. 12/30/17   Jene Every, MD  traMADol (ULTRAM) 50 MG tablet Take 1 tablet (50 mg total) by mouth every 6 (six) hours as needed. 12/30/17 12/30/18  Jene Every, MD    Allergies Patient has no known allergies.  History reviewed. No pertinent family history.  Social History Social History   Tobacco Use  . Smoking status: Current Some Day Smoker    Packs/day: 0.50    Types: Cigarettes  . Smokeless tobacco: Never Used  Substance Use Topics  . Alcohol use: Yes    Comment: occasionally  . Drug use: Yes    Frequency: 3.0 times per week    Types: Marijuana, Cocaine    Review of Systems  Constitutional: No fever/chills Eyes: No visual changes. ENT: No sore throat. Cardiovascular: Denies chest pain. Respiratory: Denies shortness of breath. Gastrointestinal: No abdominal pain.  No nausea, no vomiting.  No diarrhea.  No constipation. Genitourinary: Negative for dysuria. Musculoskeletal: Negative for back pain. Skin: Negative for rash. Neurological: Negative  for headaches, focal weakness    ____________________________________________   PHYSICAL EXAM:  VITAL SIGNS: ED Triage Vitals  Enc Vitals Group     BP 03/29/18 1408 (!) 155/106     Pulse Rate 03/29/18 1408 84     Resp 03/29/18 1408 18     Temp 03/29/18 1408 98.5 F (36.9 C)     Temp Source 03/29/18 1408 Oral     SpO2 03/29/18 1408 97 %     Weight 03/29/18 1409 217 lb (98.4 kg)     Height 03/29/18 1409 6' (1.829 m)     Head Circumference --      Peak Flow --      Pain Score 03/29/18 1402 10     Pain Loc --      Pain Edu? --      Excl. in GC? --     Constitutional: Alert and oriented. Well appearing and in no acute distress. Eyes: Conjunctivae are normal.  Head: Atraumatic. Nose: No congestion/rhinnorhea. Mouth/Throat: Mucous membranes are moist.  Oropharynx non-erythematous. Neck: No stridor. Cardiovascular: Normal rate, regular rhythm. Grossly normal heart sounds.  Good peripheral circulation. Respiratory: Normal respiratory effort.  No retractions. Lungs CTAB. Gastrointestinal: Soft and nontender. No distention. No abdominal bruits. No CVA tenderness. Musculoskeletal: No lower extremity tenderness nor edema.  Neurologic:  Normal speech and language. No gross focal neurologic deficits are appreciated.  Skin:  Skin is warm, dry and intact. No  rash noted.   ____________________________________________   LABS (all labs ordered are listed, but only abnormal results are displayed)  Labs Reviewed  COMPREHENSIVE METABOLIC PANEL - Abnormal; Notable for the following components:      Result Value   Potassium 3.3 (*)    Glucose, Bld 131 (*)    Calcium 8.4 (*)    Albumin 3.3 (*)    AST 180 (*)    ALT 83 (*)    Alkaline Phosphatase 173 (*)    Total Bilirubin 1.3 (*)    All other components within normal limits  ETHANOL - Abnormal; Notable for the following components:   Alcohol, Ethyl (B) 129 (*)    All other components within normal limits  CBC - Abnormal; Notable  for the following components:   Platelets 101 (*)    All other components within normal limits  URINE DRUG SCREEN, QUALITATIVE ([facility] ONLY) - Abnormal; Notable for the following components:   Cocaine Metabolite,Ur Manning POSITIVE (*)    All other components within normal limits   ____________________________________________  EKG   ____________________________________________  RADIOLOGY  ED MD interpretation:   Official radiology report(s): No results found.  ____________________________________________   PROCEDURES  Procedure(s) performed (including Critical Care):  Procedures   ____________________________________________   INITIAL IMPRESSION / ASSESSMENT AND PLAN / ED COURSE Patient is clinically sober.  He does not want to follow-up with [facility].  He just wants to go home.  He thinks he can stop his alcohol and drug use by himself.  TTS is discussed this with him at some length.  He is not homicidal or suicidal we will let him go               ____________________________________________   FINAL CLINICAL IMPRESSION(S) / ED DIAGNOSES  Final diagnoses:  Alcohol abuse  Cocaine dependence with intoxication and without complication [facility])     ED Discharge Orders    None       Note:  This document was prepared using Dragon voice recognition software and may include unintentional dictation errors.    Arnaldo Natal, MD 03/29/18 [id number]

## 2018-03-29 NOTE — Discharge Instructions (Addendum)
Please try to stop using the drugs.  AA or NA that is Alcoholics Anonymous or Narcotics Anonymous other to best things we have to help you.  You can also follow-up with [facility] here in town for the alcohol.  Please return here as needed.

## 2018-03-29 NOTE — ED Notes (Signed)
Currently resting in bed.

## 2018-07-22 IMAGING — US US ABDOMEN LIMITED
1 series · 14 of 25 positions shown · non-contrast
Comparison: None.

CLINICAL DATA: Initial evaluation for acute right upper quadrant
pain.

EXAM:
US ABDOMEN LIMITED - RIGHT UPPER QUADRANT

[Series 1: us abdomen limited · 0.21mm/px · 14 of 44 slices shown]
[im 1/44]
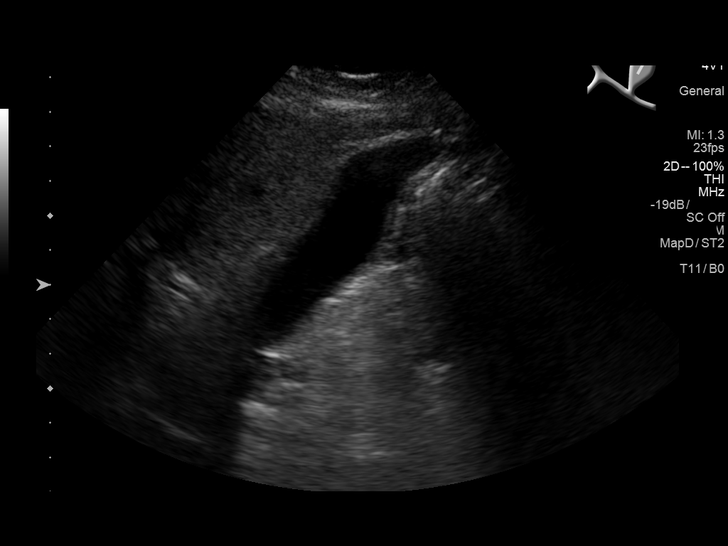
[im 4/44]
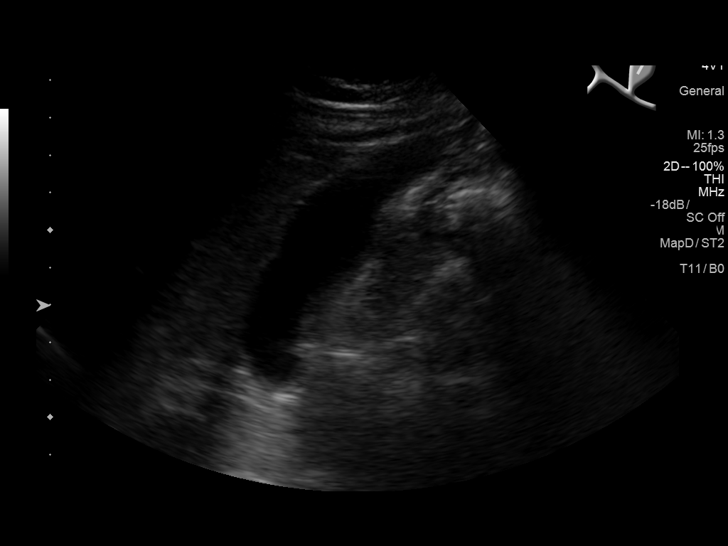
[im 8/44]
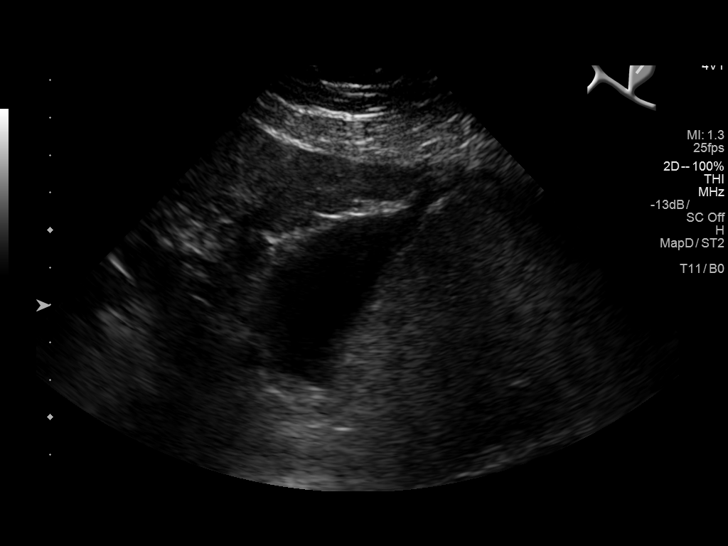
[im 11/44]
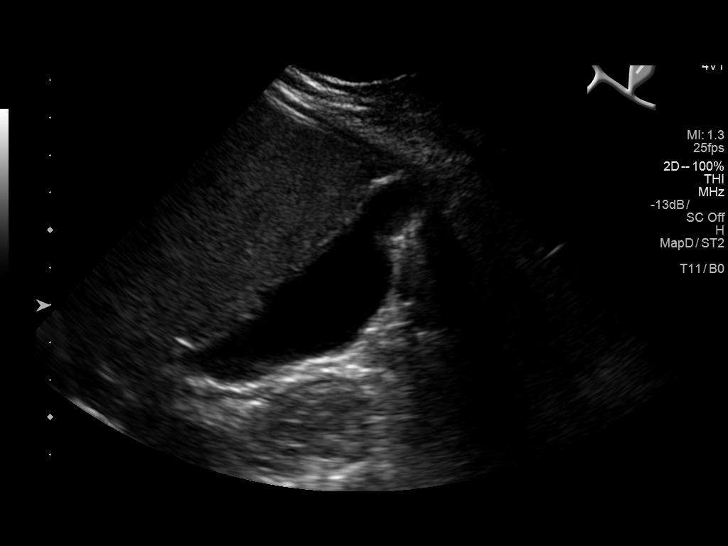
[im 15/44]
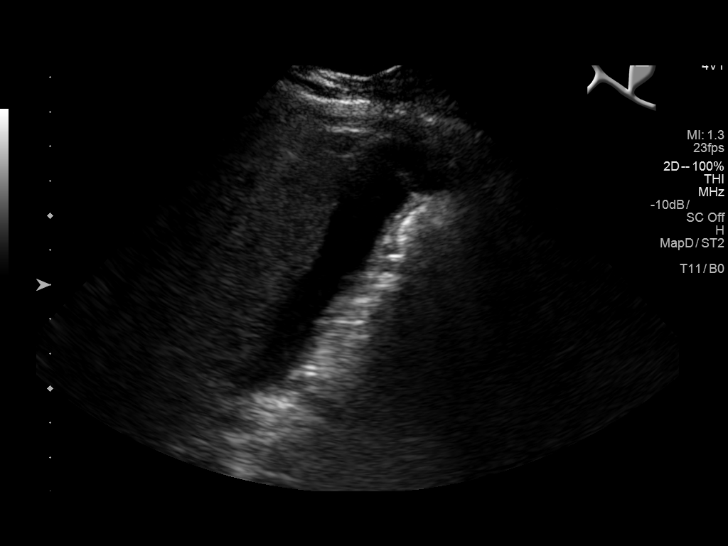
[im 17/44]
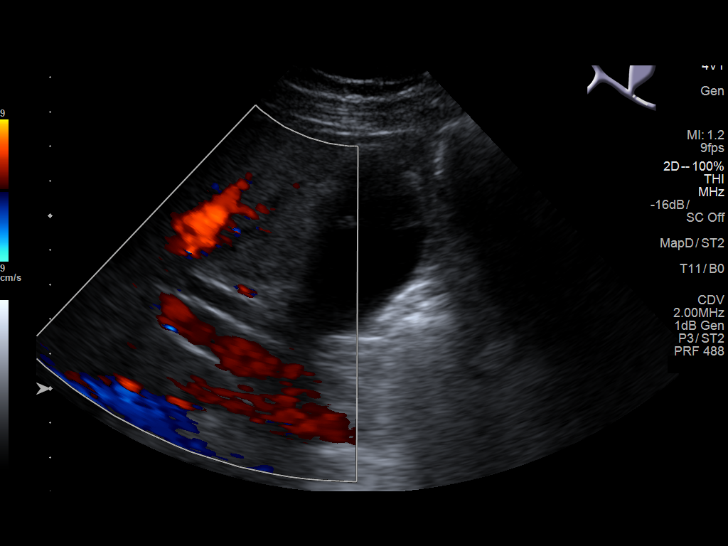
[im 20/44]
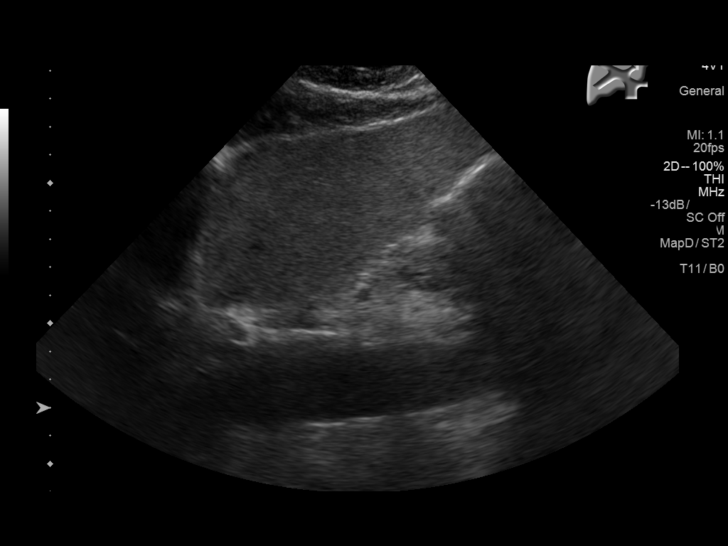
[im 24/44]
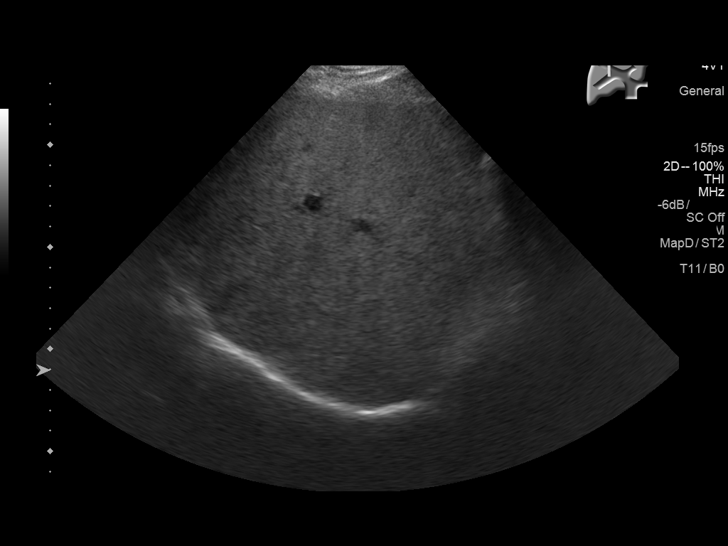
[im 27/44]
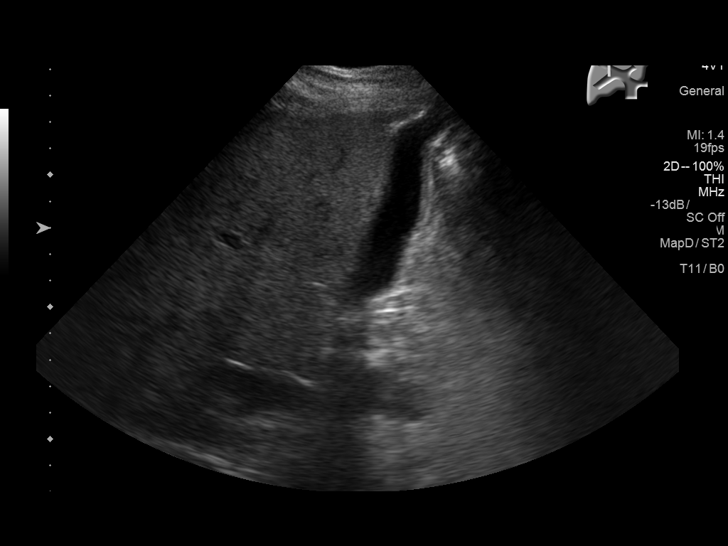
[im 29/44]
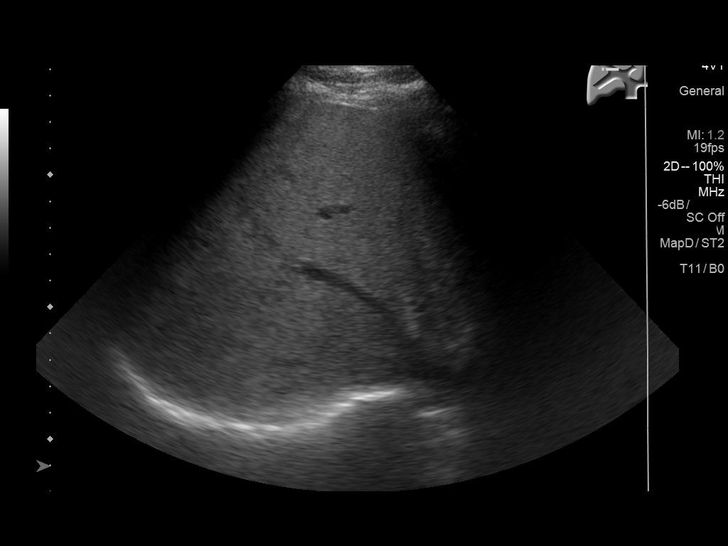
[im 33/44]
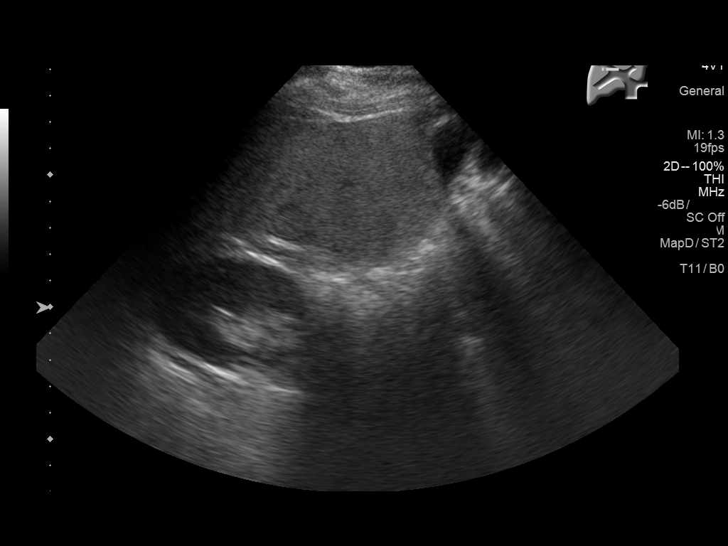
[im 36/44]
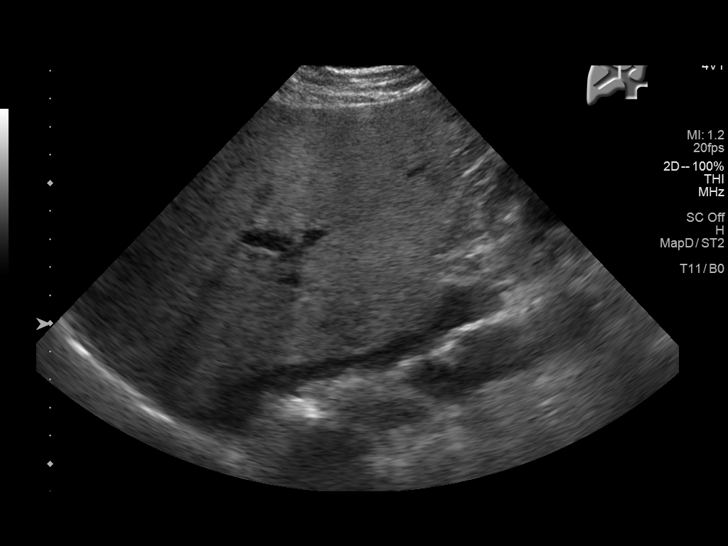
[im 40/44]
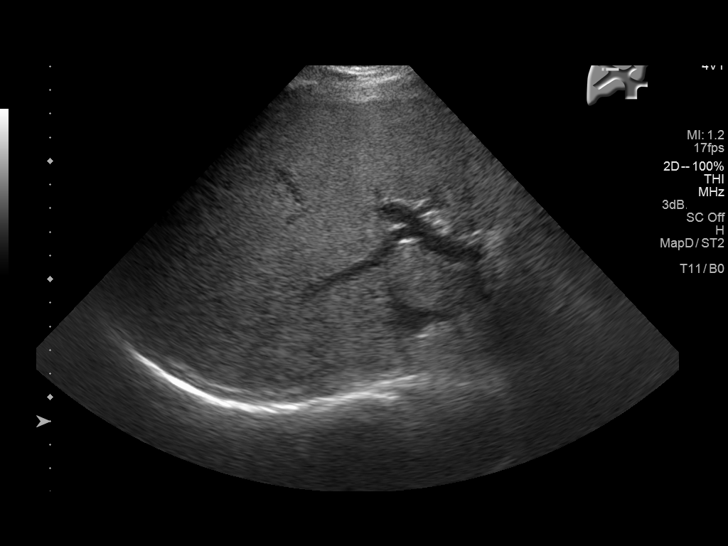
[im 44/44]
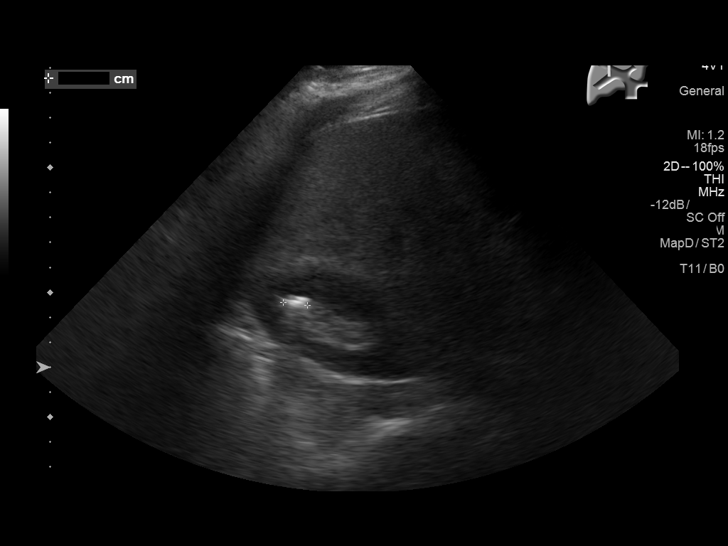

[14 of 25 positions shown; findings below may reference images not displayed]

FINDINGS: Gallbladder:

No gallstones or wall thickening visualized. No sonographic Murphy
sign noted by sonographer.

Common bile duct:

Diameter: 5.7 mm

Liver:

No focal lesion identified.  Diffusely increased echogenicity.

Incidental note made of a 1 cm right renal calculus. No
hydronephrosis.
IMPRESSION: 1. Normal sonographic appearance of the gallbladder.
2. Hepatic steatosis.
3. Incidental 1 cm nonobstructive right renal calculus.

## 2019-11-17 DEATH — deceased
